# Patient Record
Sex: Female | Born: 1985 | Race: White | Hispanic: Yes | Marital: Single | Smoking: Current some day smoker
Health system: Southern US, Community
[De-identification: ages and names within clinical notes are randomized; demographics above are authoritative.]

---

## 2015-12-07 ENCOUNTER — Ambulatory Visit (HOSPITAL_COMMUNITY)
Admission: EM | Admit: 2015-12-07 | Discharge: 2015-12-07 | Disposition: A | Discharge status: Home / Self Care | Payer: Medicaid Other | Attending: Family Medicine | Admitting: Family Medicine

## 2015-12-07 ENCOUNTER — Encounter (HOSPITAL_COMMUNITY): Payer: Self-pay | Admitting: Emergency Medicine

## 2015-12-07 DIAGNOSIS — N644 Mastodynia: Secondary | ICD-10-CM

## 2015-12-07 DIAGNOSIS — N6452 Nipple discharge: Secondary | ICD-10-CM

## 2015-12-07 DIAGNOSIS — R590 Localized enlarged lymph nodes: Secondary | ICD-10-CM

## 2015-12-07 MED ORDER — CEPHALEXIN 500 MG PO CAPS: 500.0000 mg | ORAL_CAPSULE | Freq: Three times a day (TID) | ORAL | 0 refills | Status: DC

## 2015-12-07 NOTE — ED Provider Notes (Signed)
CSN: [id number]     Arrival date & time 12/07/15  1945 History   First MD Initiated Contact with Patient 12/07/15 2025     Chief Complaint  Patient presents with  . Breast Pain   (Consider location/radiation/quality/duration/timing/severity/associated sxs/prior Treatment) HPI  Paula Heraldmma Ruiz is a 30 y.o. female presenting to UC with c/o Left breast pain for about 1-2 weeks. Pain is aching and sore, 7/10.  She reports having a thick white discharge that looked like pus come from her nipple the other day. Denies redness or warmth of her skin. Denies fever, chills, n/v/d. No change in her menstrual cycles. No family hx of breast cancer. She is new to area, requesting resources for GYN and PCP.   History reviewed. No pertinent past medical history. History reviewed. No pertinent surgical history. No family history on file. Social History  Substance Use Topics  . Smoking status: Never Smoker  . Smokeless tobacco: Never Used  . Alcohol use No   OB History    No data available     Review of Systems  Constitutional: Negative for chills, fever and unexpected weight change.  Respiratory: Negative for cough, chest tightness and shortness of breath.   Cardiovascular: Positive for chest pain (Left breast pain). Negative for palpitations.  Gastrointestinal: Negative for diarrhea, nausea and vomiting.  Skin: Negative for color change and rash.    Allergies  Patient has no allergy information on record.  Home Medications   Prior to Admission medications   Medication Sig Start Date End Date Taking? Authorizing Provider  cephALEXin (KEFLEX) 500 MG capsule Take 1 capsule (500 mg total) by mouth 3 (three) times daily. 12/07/15   Junius FinnerErin O'Malley, PA-C   Meds Ordered and Administered this Visit  Medications - No data to display  BP 111/69 (BP Location: Left Arm)   Pulse 81   Temp 98.2 F (36.8 C) (Oral)   Resp 17   Ht 5\' 6"  (1.676 m)   Wt 165 lb (74.8 kg)   LMP 11/23/2015   SpO2 99%    BMI 26.63 kg/m  No data found.   Physical Exam  Constitutional: She is oriented to person, place, and time. She appears well-developed and well-nourished. No distress.  HENT:  Head: Normocephalic and atraumatic.  Eyes: EOM are normal.  Neck: Normal range of motion.  Cardiovascular: Normal rate and regular rhythm.   Pulmonary/Chest: Effort normal and breath sounds normal. No respiratory distress. She has no wheezes. She has no rales. She exhibits tenderness ( Left breast).  Left breast: tenderness to lateral and inferior aspect of breast. Tender Left axillary lymph notes. No nipple discharge or skin changes noted on exam.   Musculoskeletal: Normal range of motion.  Neurological: She is alert and oriented to person, place, and time.  Skin: Skin is warm and dry. She is not diaphoretic.  Psychiatric: She has a normal mood and affect. Her behavior is normal.  Nursing note and vitals reviewed.   Urgent Care Course   Clinical Course     Procedures (including critical care time)  Labs Review Labs Reviewed - No data to display  Imaging Review No results found.   MDM   1. Breast pain, left   2. Discharge from left nipple   3. Lymphadenopathy, axillary    Pt c/o Left breast pain for about 1 week and reports nipple discharge the other day that looked like pus. Will cover for potential underlying infection, however, strongly recommend f/u with GYN and breast center, especially  if symptoms not improving or new symptoms develop. [employer] resource guide provided.   Junius Finnerrin O'Malley, PA-C 12/07/15 2038

## 2015-12-07 NOTE — Discharge Instructions (Signed)
°Emergency Department Resource Guide °1) Find a Doctor and Pay Out of Pocket °Although you won't have to find out who is covered by your insurance plan, it is a good idea to ask around and get recommendations. You will then need to call the office and see if the doctor you have chosen will accept you as a new patient and what types of options they offer for patients who are self-pay. Some doctors offer discounts or will set up payment plans for their patients who do not have insurance, but you will need to ask so you aren't surprised when you get to your appointment. ° °2) Contact Your Local Health Department °Not all health departments have doctors that can see patients for sick visits, but many do, so it is worth a call to see if yours does. If you don't know where your local health department is, you can check in your phone book. The CDC also has a tool to help you locate your state's health department, and many state websites also have listings of all of their local health departments. ° °3) Find a Walk-in Clinic °If your illness is not likely to be very severe or complicated, you may want to try a walk in clinic. These are popping up all over the country in pharmacies, drugstores, and shopping centers. They're usually staffed by nurse practitioners or physician assistants that have been trained to treat common illnesses and complaints. They're usually fairly quick and inexpensive. However, if you have serious medical issues or chronic medical problems, these are probably not your best option. ° °No Primary Care Doctor: °- Call [facility] at  832-8000 - they can help you locate a primary care doctor that  accepts your insurance, provides certain services, etc. °- Physician Referral Service- 1-800-533-3463 ° °Chronic Pain Problems: °Organization         Address  Phone   Notes  °[facility]  (336) 297-2271 Patients need to be referred by their primary care doctor.  ° °Medication  Assistance: °Organization         Address  Phone   Notes  °[place] [facility] [address] °[place], [place] [address] (336) 641-8030 --Must be a resident of [place] °-- Must have NO insurance coverage whatsoever (no Medicaid/ Medicare, etc.) °-- The pt. MUST have a primary care doctor that directs their care regularly and follows them in the community °  °[facility]  (866) 331-1348   °[employer]  (888) 892-1162   ° °Agencies that provide inexpensive medical care: °Organization         Address  Phone   Notes  °[facility]  (336) 832-8035   °[facility]    (336) 832-7272   °[facility] [address] °[place], [place] [address] (336) 832-4777   °[facility] [address], °[place] (336) 271-4999   °[facility]    (336) 373-0678   °[facility]    (336) 272-1050   °[facility] ° [address], [place] Phone:  (336) 832-4444, Fax:  (336) 832-4440 Hours of Operation:  9 am - 6 pm, M-F.  Also accepts Medicaid/Medicare and self-pay.  °[facility] ° [address], [place] Phone: (336) 832-3150, Fax: (336) 832-3151. Hours of Operation:  8:30 am - 5:30 pm, M-F.  Also accepts Medicaid and self-pay.  °[facility] [place] [address], [place] Phone: (336) 878-6027   °[facility] [address], [place], [place] (336)723-1848, Ext. 123 Mondays & Thursdays: 7-9 AM.  First 15 patients are seen on a first come, first serve basis. °  ° °Medicaid-accepting [place] Providers: ° °Organization         Address  Phone   Notes  °[facility] [address], [place] (336) 641-2100 Also accepts self-pay patients.  °[facility] [address], [place] ° (336) 856-9996   °[facility] [address], [place]  (336) 288-8857   °[facility] [address] [place] [address], [place] (336) 299-7000   °Veita Bland [address], [place]  ° (336) 373-1557 Only accepts [place] Access Medicaid patients after they have their name applied to their card.  ° °Self-Pay (no insurance) in [place]: ° °Organization         Address  Phone   Notes  °Sickle Cell Patients, [facility] [address], [place] (336) 832-1970   °[facility] [address], [place] (336) 832-4400   °[facility] ° [address], [place] (336) 992-4800   °[facility]/Dr. Osei-Bonsu ° [address] [place] [address], [place] or [address], [place] (336) 841-8500 Phone number for both [place] and [place] locations is the same.  °[facility] [address], [place] (336) 299-0000   °[facility] [address] [place] [address], [place] or [address] (336) 852-7530 °(336) 878-2260   °[facility] [address], [place] (336) 350-1642, phone; (336) 294-5005, fax Sees patients 1st and 3rd Saturday of every month.  Must not qualify for public or private insurance (i.e. Medicaid, Medicare, [place] Health Choice, Veterans' Benefits) • Household income should be no more than 200% of the poverty level •The clinic cannot treat you if you are pregnant or think you are pregnant • Sexually transmitted diseases are not treated at the clinic.  ° ° °Dental Care: °Organization         Address  Phone  Notes  °[place] [facility] [address], [place] (336) 641-6152 Accepts children up to age 21 who are enrolled in Medicaid or [place] Health Choice; pregnant women with a Medicaid card; and children who have applied for Medicaid or [place] Health Choice, but were declined, whose parents can pay a reduced fee at time of service.  °[place]  [facility] [place]  [address], [place] (336) 641-7733 Accepts children up to age 21 who are enrolled in Medicaid or [place] Health Choice; pregnant women with a Medicaid card; and children who have applied for Medicaid or [place] Health Choice, but were declined, whose parents can pay a reduced fee at time of service.  °[facility] ° [address],  (336) 641-4533 Patients are seen by appointment only. Walk-ins are not accepted. [facility] will see patients 18 years of age and older. °Monday - Tuesday (8am-5pm) °Most Wednesdays (8:30-5pm) °$30 per visit, cash only  °[facility] ° [address], [place] (336) 641-4533 Patients are seen by appointment only. Walk-ins are not accepted. [facility] will see patients 18 years of age and older. °One   Wednesday Evening (Monthly: Volunteer Based).  $30 per visit, cash only  °[facility]  (919) 537-3737 for adults; Children under age 4, call Graduate Pediatric Dentistry at (919) [phone]. Children aged 4-14, please call (919) [phone] to request a pediatric application. ° Dental services are provided in all areas of dental care including fillings, crowns and bridges, complete and partial dentures, implants, gum treatment, root canals, and extractions. Preventive care is also provided. Treatment is provided to both adults and children. °Patients are selected via a lottery and there is often a waiting list. °  °[facility] [address], °[place] ° (336) 763-8833 [email or web address] °  °[facility] [address], [place], [place] (336)723-1848, Ext. 123 Second and Fourth Thursday of each month, opens at 6:30 AM; Clinic ends at 9 AM.  Patients are seen on a first-come first-served basis, and a limited number are seen during each clinic.  ° °[facility] ° [address], [place], [place] (336) 723-7904    Eligibility Requirements °You must have lived in [place], [place], or [place] counties for at least the last three months. °  You cannot be eligible for state or federal sponsored healthcare insurance, including Veterans Administration, Medicaid, or Medicare. °  You generally cannot be eligible for healthcare insurance through your employer.  °  How to apply: °Eligibility screenings are held every Tuesday and Wednesday afternoon from 1:00 pm until 4:00 pm. You do not need an appointment for the interview!  °[facility] [address], [place], [place] 336-631-2330   °[facility]  336-342-8273   °[facility]  336-703-3100   °[facility]  336-570-6415   ° °Behavioral Health Resources in the Community: °Intensive Outpatient Programs °Organization         Address  Phone  Notes  °[facility] [address], [place], [place] 336-878-6098   °[facility] [address], [place], [place] 336-832-9800   °[facility] [address], [place], [place] ° 336-882-2125   °[facility] [address],  °[place], [place] 1-800-853-5163 or 336-641-4981   °Substance Abuse Resources °Organization         Address  Phone  Notes  °[facility]  336-882-2125   °[facility]  336-784-9470   °[facility]  336-285-9073   °[facility]  336-845-3988   °Residential & Outpatient Substance Abuse Program  1-800-659-3381   °Psychological Services °Organization         Address  Phone  Notes  °[facility]  336- 832-9600   °[facility]  336- 378-7881   °[facility] [address], [place] 1-800-853-5163 or 336-641-4981   ° °Mobile Crisis Teams °Organization         Address  Phone  Notes  °[facility], Mobile Crisis Care Unit  1-877-626-1772   °Assertive °[facility] ° [address]  [place], [place] 336-834-9664   °Sharon DeEsch [address] °[place] 336-554-5454   ° °Self-Help/Support Groups °Organization         Address  Phone             Notes  °[facility] - variety of support groups  336- 373-1402 Call for more information  °Narcotics Anonymous (NA), [facility] [address], °[place]   2 meetings at this location  ° °  Residential Treatment Programs °Organization         Address  Phone  Notes  °[facility] [address],    °[place]  1-866-801-8205   °[facility] ° [address], [place], [place] 704-293-8524   °[facility] [address], [place] 336-845-3988 Admissions: 8am-3pm M-F  °[facility] [address]    °[place], [place] 336-841-1104   °[facility] [address], [place], [place] 336-379-7146   °[facility] [address]  °[place], [place] 336-285-9073   °[facility] Intensive Outpatient [address], [place], [place] 336-852-3033   °[facility] [address]  °[place], [place] 1-877-615-2722 or 336-784-9470   °[facility] [address] [place], [place] 336-227-7417 Accepts Medicaid  °[facility] [address]  °[place] 1-800-659-3381 Substance Abuse/Addiction Treatment  ° °[place] Behavioral Health Resources °Organization         Address  Phone  Notes  °[facility]  (888) 581-9988   °Julie Brannon, PhD [address] [place], [place]   (336) 349-5553 or (336) 951-0000   °[facility]   [address] °[place], [place] (336) 349-4454   °[facility] [address], [place], [place] (336) 342-8316 Insurance/Medicaid/sponsorship through [facility]  °[facility] [address]                                    [place], [place] (336) 342-8316 Therapy/tele-psych/case    °[facility] [address]  ° [place],  (336) 349-2233    °Dr. Arfeen  (336) 349-4544   °[facility] [place]  [employer] [place] [facility] 1) [address], [place] °2) [address], [place] °3)  [address], [place] (336) 349-3220 °(336) 342-7768 ° °(336) 342-8140   °[place] [facility] (336) 342-1394 or (336) 342-3537 (After Hours)    ° ° °

## 2015-12-07 NOTE — ED Triage Notes (Signed)
Pt. Stated, I've been having left breast pain for over a week half

## 2018-01-28 ENCOUNTER — Other Ambulatory Visit: Payer: Self-pay

## 2018-01-28 ENCOUNTER — Encounter (HOSPITAL_COMMUNITY): Payer: Self-pay | Admitting: *Deleted

## 2018-01-28 ENCOUNTER — Ambulatory Visit (HOSPITAL_COMMUNITY)
Admission: EM | Admit: 2018-01-28 | Discharge: 2018-01-28 | Disposition: A | Discharge status: Home / Self Care | Payer: Medicaid Other | Attending: Family Medicine | Admitting: Family Medicine

## 2018-01-28 DIAGNOSIS — F17200 Nicotine dependence, unspecified, uncomplicated: Secondary | ICD-10-CM | POA: Diagnosis not present

## 2018-01-28 DIAGNOSIS — J069 Acute upper respiratory infection, unspecified: Secondary | ICD-10-CM | POA: Insufficient documentation

## 2018-01-28 DIAGNOSIS — J029 Acute pharyngitis, unspecified: Secondary | ICD-10-CM | POA: Diagnosis not present

## 2018-01-28 DIAGNOSIS — B9789 Other viral agents as the cause of diseases classified elsewhere: Secondary | ICD-10-CM

## 2018-01-28 LAB — POCT RAPID STREP A: Streptococcus, Group A Screen (Direct): NEGATIVE

## 2018-01-28 MED ORDER — CETIRIZINE HCL 10 MG PO CAPS: 10.0000 mg | ORAL_CAPSULE | Freq: Every day | ORAL | 0 refills | Status: DC

## 2018-01-28 MED ORDER — AMOXICILLIN-POT CLAVULANATE 875-125 MG PO TABS: 1.0000 | ORAL_TABLET | Freq: Two times a day (BID) | ORAL | 0 refills | Status: AC

## 2018-01-28 MED ORDER — PSEUDOEPH-BROMPHEN-DM 30-2-10 MG/5ML PO SYRP: 5.0000 mL | ORAL_SOLUTION | Freq: Four times a day (QID) | ORAL | 0 refills | Status: DC | PRN

## 2018-01-28 NOTE — Discharge Instructions (Signed)
Sore Throat  Your rapid strep tested Negative today. We will send for a culture and call in about 2 days if results are positive. For now we will treat your sore throat as a virus with symptom management.   Please begin daily cetirizine Cough syrup as needed May fill prescription for Augmentin on Wednesday if you are not having any improvement in your symptoms  Please continue Tylenol or Ibuprofen for fever and pain. May try salt water gargles, cepacol lozenges, throat spray, or OTC cold relief medicine for throat discomfort. If you also have congestion take a daily anti-histamine like Zyrtec, Claritin, and a oral decongestant to help with post nasal drip that may be irritating your throat.   Stay hydrated and drink plenty of fluids to keep your throat coated relieve irritation.

## 2018-01-28 NOTE — ED Triage Notes (Signed)
C/O productive cough and sore throat x 1 wk; states had tactile fever few days ago.

## 2018-01-29 NOTE — ED Provider Notes (Signed)
[facility]    CSN: [id number] Arrival date & time: 01/28/18  1358     History   Chief Complaint Chief Complaint  Patient presents with  . Cough  . Sore Throat    HPI Paula Ruiz is a 32 y.o. female no significant past medical history presenting today for evaluation of cough and sore throat.  Patient states that she has had symptoms for the past 5 to 6 days.  She has had subjective fevers.  Noted she is coughing up a lot of mucus.  She has tried DayQuil, Vicks and Mucinex without relief.  Main complaint is her sore throat.  HPI  History reviewed. No pertinent past medical history.  There are no active problems to display for this patient.   History reviewed. No pertinent surgical history.  OB History   No obstetric history on file.      Home Medications    Prior to Admission medications   Medication Sig Start Date End Date Taking? Authorizing Provider  amoxicillin-clavulanate (AUGMENTIN) 875-125 MG tablet Take 1 tablet by mouth every 12 (twelve) hours for 10 days. 01/31/18 02/10/18  Wieters, Hallie C, PA-C  brompheniramine-pseudoephedrine-DM 30-2-10 MG/5ML syrup Take 5 mLs by mouth 4 (four) times daily as needed. 01/28/18   Wieters, Hallie C, PA-C  Cetirizine HCl 10 MG CAPS Take 1 capsule (10 mg total) by mouth daily for 10 days. 01/28/18 02/07/18  Wieters, Junius CreamerHallie C, PA-C    Family History History reviewed. No pertinent family history.  Social History Social History   Tobacco Use  . Smoking status: Current Some Day Smoker  . Smokeless tobacco: Never Used  Substance Use Topics  . Alcohol use: Yes    Comment: occasionally  . Drug use: Never     Allergies   Patient has no known allergies.   Review of Systems Review of Systems  Constitutional: Positive for chills. Negative for activity change, appetite change, fatigue and fever.  HENT: Positive for congestion, rhinorrhea and sore throat. Negative for ear pain, sinus pressure and trouble  swallowing.   Eyes: Negative for discharge and redness.  Respiratory: Positive for cough. Negative for chest tightness and shortness of breath.   Cardiovascular: Negative for chest pain.  Gastrointestinal: Negative for abdominal pain, diarrhea, nausea and vomiting.  Musculoskeletal: Negative for myalgias.  Skin: Negative for rash.  Neurological: Negative for dizziness, light-headedness and headaches.     Physical Exam Triage Vital Signs ED Triage Vitals  Enc Vitals Group     BP 01/28/18 1518 122/81     Pulse Rate 01/28/18 1518 70     Resp 01/28/18 1518 16     Temp 01/28/18 1518 98.2 F (36.8 C)     Temp Source 01/28/18 1518 Oral     SpO2 01/28/18 1518 97 %     Weight --      Height --      Head Circumference --      Peak Flow --      Pain Score 01/28/18 1519 8     Pain Loc --      Pain Edu? --      Excl. in GC? --    No data found.  Updated Vital Signs BP 122/81   Pulse 70   Temp 98.2 F (36.8 C) (Oral)   Resp 16   LMP 01/16/2018 (Approximate)   SpO2 97%   Visual Acuity Right Eye Distance:   Left Eye Distance:   Bilateral Distance:    Right Eye  Near:   Left Eye Near:    Bilateral Near:     Physical Exam Vitals signs and nursing note reviewed.  Constitutional:      General: She is not in acute distress.    Appearance: She is well-developed.  HENT:     Head: Normocephalic and atraumatic.     Ears:     Comments: Bilateral ears without tenderness to palpation of external auricle, tragus and mastoid, EAC's without erythema or swelling, TM's with good bony landmarks and cone of light. Non erythematous.    Nose:     Comments: Nasal mucosa slightly erythematous    Mouth/Throat:     Comments: Oral mucosa pink and moist, no tonsillar enlargement or exudate. Posterior pharynx patent and erythematous, no uvula deviation or swelling. Normal phonation. Eyes:     Conjunctiva/sclera: Conjunctivae normal.  Neck:     Musculoskeletal: Neck supple.  Cardiovascular:      Rate and Rhythm: Normal rate and regular rhythm.     Heart sounds: No murmur.  Pulmonary:     Effort: Pulmonary effort is normal. No respiratory distress.     Breath sounds: Normal breath sounds.     Comments: Breathing comfortably at rest, CTABL, no wheezing, rales or other adventitious sounds auscultated Abdominal:     Palpations: Abdomen is soft.     Tenderness: There is no abdominal tenderness.  Skin:    General: Skin is warm and dry.  Neurological:     Mental Status: She is alert.      UC Treatments / Results  Labs (all labs ordered are listed, but only abnormal results are displayed) Labs Reviewed  CULTURE, GROUP A STREP [facility]  POCT RAPID STREP A    EKG None  Radiology No results found.  Procedures Procedures (including critical care time)  Medications Ordered in UC Medications - No data to display  Initial Impression / Assessment and Plan / UC Course  I have reviewed the triage vital signs and the nursing notes.  Pertinent labs & imaging results that were available during my care of the patient were reviewed by me and considered in my medical decision making (see chart for details).     Strep test negative.  Sore throat likely secondary to postnasal drainage.  No other URI symptoms for approximately 1 week.  Will recommend continued symptomatic management.  Recommended starting daily allergy pill to treat postnasal drainage.  Did provide prescription for Augmentin to fill on Wednesday in 3 to 4 days if still not having any improvement with continued symptomatic management.  Continue to monitor symptoms, temperature,Discussed strict return precautions. Patient verbalized understanding and is agreeable with plan.  Final Clinical Impressions(s) / UC Diagnoses   Final diagnoses:  Viral URI with cough  Sore throat     Discharge Instructions     Sore Throat  Your rapid strep tested Negative today. We will send for a culture and call in about 2 days if  results are positive. For now we will treat your sore throat as a virus with symptom management.   Please begin daily cetirizine Cough syrup as needed May fill prescription for Augmentin on Wednesday if you are not having any improvement in your symptoms  Please continue Tylenol or Ibuprofen for fever and pain. May try salt water gargles, cepacol lozenges, throat spray, or OTC cold relief medicine for throat discomfort. If you also have congestion take a daily anti-histamine like Zyrtec, Claritin, and a oral decongestant to help with post nasal drip  that may be irritating your throat.   Stay hydrated and drink plenty of fluids to keep your throat coated relieve irritation.      ED Prescriptions    Medication Sig Dispense Auth. Provider   brompheniramine-pseudoephedrine-DM 30-2-10 MG/5ML syrup Take 5 mLs by mouth 4 (four) times daily as needed. 120 mL Wieters, Hallie C, PA-C   Cetirizine HCl 10 MG CAPS Take 1 capsule (10 mg total) by mouth daily for 10 days. 10 capsule Wieters, Hallie C, PA-C   amoxicillin-clavulanate (AUGMENTIN) 875-125 MG tablet Take 1 tablet by mouth every 12 (twelve) hours for 10 days. 20 tablet Wieters, Redby C, PA-C     Controlled Substance Prescriptions [place] Controlled Substance Registry consulted? Not Applicable   Lew Dawes, [place] 01/29/18 2031

## 2018-01-31 LAB — CULTURE, GROUP A STREP (THRC)

## 2019-08-22 ENCOUNTER — Other Ambulatory Visit: Payer: Self-pay

## 2019-08-22 ENCOUNTER — Encounter (HOSPITAL_COMMUNITY): Payer: Self-pay

## 2019-08-22 ENCOUNTER — Ambulatory Visit (INDEPENDENT_AMBULATORY_CARE_PROVIDER_SITE_OTHER): Payer: Medicaid Other

## 2019-08-22 ENCOUNTER — Ambulatory Visit (HOSPITAL_COMMUNITY)
Admission: EM | Admit: 2019-08-22 | Discharge: 2019-08-22 | Disposition: A | Discharge status: Home / Self Care | Payer: Medicaid Other | Attending: Physician Assistant | Admitting: Physician Assistant

## 2019-08-22 DIAGNOSIS — M25562 Pain in left knee: Secondary | ICD-10-CM | POA: Diagnosis not present

## 2019-08-22 DIAGNOSIS — S8002XA Contusion of left knee, initial encounter: Secondary | ICD-10-CM

## 2019-08-22 MED ORDER — DICLOFENAC SODIUM 1 % EX GEL: 4.0000 g | Freq: Four times a day (QID) | CUTANEOUS | 0 refills | Status: DC

## 2019-08-22 MED ORDER — ACETAMINOPHEN 325 MG PO TABS: 650.0000 mg | ORAL_TABLET | Freq: Four times a day (QID) | ORAL | 0 refills | Status: DC | PRN

## 2019-08-22 NOTE — ED Triage Notes (Addendum)
L knee pain after GLF on Sunday, pt ambulatory

## 2019-08-22 NOTE — Discharge Instructions (Signed)
There was no breaks or fractures on your xray  Apply the gel/cream 4 times a day Take 2 tylenol every 6 hours Alternate ice and heat Use ace wrap as needed  Follow up with PCP or sports med group as needed if not improving

## 2019-08-22 NOTE — ED Provider Notes (Signed)
[facility]    CSN: [id number] Arrival date & time: 08/22/19  1557      History   Chief Complaint Chief Complaint  Patient presents with  . Knee Pain    HPI Paula Ruiz is a 34 y.o. female.   Patient reports her left knee pain.  She reports she fell 5 days ago directly onto the left knee.  She reports she did not twist the knee.  She reports there was some swelling and quite a bit of pain and took it easy for a few days.  She has since been trying to limp but is had progressive worsening pain in the front of her knee.  Swelling is resolved mostly.  She reports she had a surgery on this knee previously when she had broken it.  She does not remember exactly what they did.  She denies any numbness or tingling in the leg.     History reviewed. No pertinent past medical history.  There are no problems to display for this patient.   History reviewed. No pertinent surgical history.  OB History   No obstetric history on file.      Home Medications    Prior to Admission medications   Medication Sig Start Date End Date Taking? Authorizing Provider  acetaminophen (TYLENOL) 325 MG tablet Take 2 tablets (650 mg total) by mouth every 6 (six) hours as needed. 08/22/19   Sarkis Rhines, Veryl Speak, PA-C  diclofenac Sodium (VOLTAREN) 1 % GEL Apply 4 g topically 4 (four) times daily. 08/22/19   Dois Juarbe, Veryl Speak, PA-C    Family History History reviewed. No pertinent family history.  Social History Social History   Tobacco Use  . Smoking status: Current Some Day Smoker  . Smokeless tobacco: Never Used  Vaping Use  . Vaping Use: Every day  Substance Use Topics  . Alcohol use: Yes    Comment: occasionally  . Drug use: Never     Allergies   Patient has no known allergies.   Review of Systems Review of Systems   Physical Exam Triage Vital Signs ED Triage Vitals  Enc Vitals Group     BP 08/22/19 1647 125/75     Pulse Rate 08/22/19 1647 62     Resp 08/22/19 1647 16      Temp 08/22/19 1647 (!) 97 F (36.1 C)     Temp src --      SpO2 08/22/19 1647 100 %     Weight --      Height --      Head Circumference --      Peak Flow --      Pain Score 08/22/19 1648 5     Pain Loc --      Pain Edu? --      Excl. in GC? --    No data found.  Updated Vital Signs BP 125/75   Pulse 62   Temp (!) 97 F (36.1 C)   Resp 16   LMP 06/22/2019 Comment: patient's BC keeps here from not having a mentral cycle  SpO2 100%   Visual Acuity Right Eye Distance:   Left Eye Distance:   Bilateral Distance:    Right Eye Near:   Left Eye Near:    Bilateral Near:     Physical Exam Vitals and nursing note reviewed.  Constitutional:      Appearance: Normal appearance.  Musculoskeletal:     Comments: Left knee without significant swelling or effusion.  No deformity.  No ecchymosis.  Small abrasion just inferior to the patella.  Joint is not warm.  Tenderness over the patella and infrapatellar.  Pain with patellar compression.  Some pain at the medial and lateral joint lines as well.  Anterior drawer stable.  Posterior drawer stable.  Range of motion intact however pain elicited with terminal extension.  Patient is able to bear weight however significant limp.  Distal pulse 2+.  Sensation intact.  Skin:    Capillary Refill: Capillary refill takes less than 2 seconds.  Neurological:     Mental Status: She is alert.      UC Treatments / Results  Labs (all labs ordered are listed, but only abnormal results are displayed) Labs Reviewed - No data to display  EKG   Radiology DG Knee Complete 4 Views Left  Result Date: 08/22/2019 CLINICAL DATA:  Fall with worsening knee pain EXAM: LEFT KNEE - COMPLETE 4+ VIEW COMPARISON:  None. FINDINGS: No fracture or malalignment. Joint spaces are maintained. Probable small knee effusion. IMPRESSION: No acute osseous abnormality. Electronically Signed   By: Jasmine Pang M.D.   On: 08/22/2019 18:47    Procedures Procedures  (including critical care time)  Medications Ordered in UC Medications - No data to display  Initial Impression / Assessment and Plan / UC Course  I have reviewed the triage vital signs and the nursing notes.  Pertinent labs & imaging results that were available during my care of the patient were reviewed by me and considered in my medical decision making (see chart for details).    #Knee contusion Patient is a 34 year old presenting with knee contusion.  Fracture negative for bony pathology.  Will supply Ace wrap.  Recommend topical Voltaren and Tylenol with ice and heat therapy.  Discussed option for follow-up with the sports medicine group if not improving her primary care as needed.  Patient verbalized understanding plan of care.  Final Clinical Impressions(s) / UC Diagnoses   Final diagnoses:  Contusion of left knee, initial encounter     Discharge Instructions     There was no breaks or fractures on your xray  Apply the gel/cream 4 times a day Take 2 tylenol every 6 hours Alternate ice and heat Use ace wrap as needed  Follow up with PCP or sports med group as needed if not improving      ED Prescriptions    Medication Sig Dispense Auth. Provider   diclofenac Sodium (VOLTAREN) 1 % GEL Apply 4 g topically 4 (four) times daily. 100 g Yesennia Hirota, Veryl Speak, PA-C   acetaminophen (TYLENOL) 325 MG tablet Take 2 tablets (650 mg total) by mouth every 6 (six) hours as needed. 30 tablet Obe Ahlers, Veryl Speak, PA-C     PDMP not reviewed this encounter.   Hermelinda Medicus, PA-C 08/22/19 2345

## 2020-06-08 ENCOUNTER — Emergency Department (HOSPITAL_COMMUNITY)
Admission: EM | Admit: 2020-06-08 | Discharge: 2020-06-08 | Disposition: A | Discharge status: Home / Self Care | Payer: Medicaid Other | Attending: Emergency Medicine | Admitting: Emergency Medicine

## 2020-06-08 ENCOUNTER — Encounter (HOSPITAL_COMMUNITY): Payer: Self-pay | Admitting: Pharmacy Technician

## 2020-06-08 ENCOUNTER — Emergency Department (HOSPITAL_COMMUNITY): Payer: Medicaid Other

## 2020-06-08 ENCOUNTER — Other Ambulatory Visit: Payer: Self-pay

## 2020-06-08 DIAGNOSIS — F17200 Nicotine dependence, unspecified, uncomplicated: Secondary | ICD-10-CM | POA: Diagnosis not present

## 2020-06-08 DIAGNOSIS — N939 Abnormal uterine and vaginal bleeding, unspecified: Secondary | ICD-10-CM | POA: Diagnosis not present

## 2020-06-08 LAB — CBC WITH DIFFERENTIAL/PLATELET
Abs Immature Granulocytes: 0.03 10*3/uL (ref 0.00–0.07)
Basophils Absolute: 0.1 10*3/uL (ref 0.0–0.1)
Basophils Relative: 1 %
Eosinophils Absolute: 0.5 10*3/uL (ref 0.0–0.5)
Eosinophils Relative: 5 %
HCT: 38.7 % (ref 36.0–46.0)
Hemoglobin: 12.8 g/dL (ref 12.0–15.0)
Immature Granulocytes: 0 %
Lymphocytes Relative: 38 %
Lymphs Abs: 3.5 10*3/uL (ref 0.7–4.0)
MCH: 30.3 pg (ref 26.0–34.0)
MCHC: 33.1 g/dL (ref 30.0–36.0)
MCV: 91.7 fL (ref 80.0–100.0)
Monocytes Absolute: 0.6 10*3/uL (ref 0.1–1.0)
Monocytes Relative: 7 %
Neutro Abs: 4.5 10*3/uL (ref 1.7–7.7)
Neutrophils Relative %: 49 %
Platelets: 257 10*3/uL (ref 150–400)
RBC: 4.22 MIL/uL (ref 3.87–5.11)
RDW: 13.6 % (ref 11.5–15.5)
WBC: 9.1 10*3/uL (ref 4.0–10.5)
nRBC: 0 % (ref 0.0–0.2)

## 2020-06-08 LAB — I-STAT BETA HCG BLOOD, ED (MC, WL, AP ONLY): I-stat hCG, quantitative: <5 m[IU]/mL (ref <5)

## 2020-06-08 MED ORDER — IBUPROFEN 600 MG PO TABS: 600.0000 mg | ORAL_TABLET | Freq: Three times a day (TID) | ORAL | 0 refills | Status: DC

## 2020-06-08 NOTE — ED Notes (Signed)
CBC redrawn per RN request. Lab did not have purple top.

## 2020-06-08 NOTE — ED Triage Notes (Signed)
Pt here POV with reports of heavy menses with blood clots. Pt states she stopped the depo shot in December and that this is here first menstrual cycle since then.

## 2020-06-08 NOTE — ED Provider Notes (Signed)
Emergency Medicine Provider Triage Evaluation Note  Paula Ruiz , a 35 y.o. female  was evaluated in triage.  Pt complains of heavy vaginal bleeding onset Wednesday last week with clots, heavier than usual menstrual cycles.  Denies abdominal pain or any other complaints or concerns.  Review of Systems  Positive: Heavy vaginal bleeding Negative: Abdominal pain  Physical Exam  BP 131/84 (BP Location: Right Arm)   Pulse 75   Temp 98.7 F (37.1 C) (Oral)   Resp 15   SpO2 100%  Gen:   Awake, no distress   Resp:  Normal effort  MSK:   Moves extremities without difficulty  Other:    Medical Decision Making  Medically screening exam initiated at 3:53 PM.  Appropriate orders placed.  Kortney Potvin was informed that the remainder of the evaluation will be completed by another provider, this initial triage assessment does not replace that evaluation, and the importance of remaining in the ED until their evaluation is complete.     Jeannie Fend, PA-C 06/08/20 1555    Bethann Berkshire, MD 06/09/20 [phone]

## 2020-06-08 NOTE — ED Notes (Signed)
All appropriate discharge materials reviewed at length with patient. Time for questions provided. Pt has no other questions at this time and verbalizes understanding of all provided materials.  

## 2020-06-08 NOTE — Discharge Instructions (Signed)
Please follow-up with your gynecologist.  Recommend taking scheduled Motrin for the next week.  If you develop worsening bleeding, pain, any episodes of passing out or vomiting, come back to ER for reassessment.

## 2020-06-08 NOTE — ED Provider Notes (Signed)
[facility] EMERGENCY DEPARTMENT Provider Note   CSN: [id number] Arrival date & time: 06/08/20  1527     History Chief Complaint  Patient presents with  . Vaginal Bleeding    Paula Ruiz is a 35 y.o. female.  Presented to the emergency room with concern for vaginal bleeding.  Patient reports that shortly over the past few days she has been having heavy bleeding.  Has used approximately 5 pads today with some clots.  She denies any other associated symptoms.  No abdominal pain, no pelvic pain.  No discharge.  States that she used to use the Depo shot but stopped that a few months ago.  Not currently on any medication.  HPI     History reviewed. No pertinent past medical history.  There are no problems to display for this patient.   History reviewed. No pertinent surgical history.   OB History   No obstetric history on file.     No family history on file.  Social History   Tobacco Use  . Smoking status: Current Some Day Smoker  . Smokeless tobacco: Never Used  Vaping Use  . Vaping Use: Every day  Substance Use Topics  . Alcohol use: Yes    Comment: occasionally  . Drug use: Never    Home Medications Prior to Admission medications   Medication Sig Start Date End Date Taking? Authorizing Provider  ibuprofen (ADVIL) 600 MG tablet Take 1 tablet (600 mg total) by mouth 3 (three) times daily. 06/08/20  Yes Milagros Loll, MD  acetaminophen (TYLENOL) 325 MG tablet Take 2 tablets (650 mg total) by mouth every 6 (six) hours as needed. 08/22/19   Darr, Gerilyn Pilgrim, PA-C  diclofenac Sodium (VOLTAREN) 1 % GEL Apply 4 g topically 4 (four) times daily. 08/22/19   Darr, Gerilyn Pilgrim, PA-C    Allergies    Patient has no known allergies.  Review of Systems   Review of Systems  Constitutional: Negative for chills and fever.  HENT: Negative for ear pain and sore throat.   Eyes: Negative for pain and visual disturbance.  Respiratory: Negative for cough and shortness of  breath.   Cardiovascular: Negative for chest pain and palpitations.  Gastrointestinal: Negative for abdominal pain and vomiting.  Genitourinary: Positive for vaginal bleeding. Negative for dysuria and hematuria.  Musculoskeletal: Negative for arthralgias and back pain.  Skin: Negative for color change and rash.  Neurological: Negative for seizures and syncope.  All other systems reviewed and are negative.   Physical Exam Updated Vital Signs BP (!) 123/93 (BP Location: Right Arm)   Pulse 72   Temp 98.5 F (36.9 C) (Oral)   Resp 18   SpO2 99%   Physical Exam Vitals and nursing note reviewed. Exam conducted with a chaperone present.  Constitutional:      General: She is not in acute distress.    Appearance: She is well-developed.  HENT:     Head: Normocephalic and atraumatic.  Eyes:     Conjunctiva/sclera: Conjunctivae normal.  Cardiovascular:     Rate and Rhythm: Normal rate and regular rhythm.     Heart sounds: No murmur heard.   Pulmonary:     Effort: Pulmonary effort is normal. No respiratory distress.     Breath sounds: Normal breath sounds.  Abdominal:     Palpations: Abdomen is soft.     Tenderness: There is no abdominal tenderness.  Genitourinary:    Comments: Chaperone present Small blood in vagina, no active bleeding noted,  normal-appearing cervix Musculoskeletal:        General: No deformity or signs of injury.     Cervical back: Neck supple.  Skin:    General: Skin is warm and dry.  Neurological:     General: No focal deficit present.     Mental Status: She is alert.  Psychiatric:        Mood and Affect: Mood normal.     ED Results / Procedures / Treatments   Labs (all labs ordered are listed, but only abnormal results are displayed) Labs Reviewed  CBC WITH DIFFERENTIAL/PLATELET  I-STAT BETA HCG BLOOD, ED ([facility], [facility], [facility] ONLY)    EKG None  Radiology US Transvaginal Non-OB  Result Date: 06/08/2020 CLINICAL DATA:  Initial evaluation for acute  pelvic pain and bleeding. EXAM: TRANSABDOMINAL AND TRANSVAGINAL ULTRASOUND OF PELVIS DOPPLER ULTRASOUND OF OVARIES TECHNIQUE: Both transabdominal and transvaginal ultrasound examinations of the pelvis were performed. Transabdominal technique was performed for global imaging of the pelvis including uterus, ovaries, adnexal regions, and pelvic cul-de-sac. It was necessary to proceed with endovaginal exam following the transabdominal exam to visualize the uterus, endometrium, and ovaries. Color and duplex Doppler ultrasound was utilized to evaluate blood flow to the ovaries. COMPARISON:  None. FINDINGS: Uterus Measurements: 7.9 x 3.8 x 4.1 cm = volume: 63.3 mL. Uterus is anteverted. No discrete fibroid or other mass. Endometrium Thickness: 6.6 mm.  No focal abnormality visualized. Right ovary Measurements: 3.9 x 2.3 x 2.2 cm = volume: 10.4 mL. Normal appearance/no adnexal mass. Left ovary Measurements: 3.8 x 2.1 x 2.2 cm = volume: 9.3 mL. Normal appearance/no adnexal mass. Pulsed Doppler evaluation of both ovaries demonstrates normal low-resistance arterial and venous waveforms. Other findings No abnormal free fluid. IMPRESSION: 1. Normal pelvic ultrasound. No evidence for torsion or other acute abnormality. 2. Endometrial stripe measures 6.6 mm in thickness. If bleeding remains unresponsive to hormonal or medical therapy, sonohysterogram should be considered for focal lesion work-up. (Ref: Radiological Reasoning: Algorithmic Workup of Abnormal Vaginal Bleeding with Endovaginal Sonography and Sonohysterography. AJR 2008; 505:L97-67). Electronically Signed   By: Rise Mu M.D.   On: 06/08/2020 21:44   US Pelvis Complete  Result Date: 06/08/2020 CLINICAL DATA:  Initial evaluation for acute pelvic pain and bleeding. EXAM: TRANSABDOMINAL AND TRANSVAGINAL ULTRASOUND OF PELVIS DOPPLER ULTRASOUND OF OVARIES TECHNIQUE: Both transabdominal and transvaginal ultrasound examinations of the pelvis were performed.  Transabdominal technique was performed for global imaging of the pelvis including uterus, ovaries, adnexal regions, and pelvic cul-de-sac. It was necessary to proceed with endovaginal exam following the transabdominal exam to visualize the uterus, endometrium, and ovaries. Color and duplex Doppler ultrasound was utilized to evaluate blood flow to the ovaries. COMPARISON:  None. FINDINGS: Uterus Measurements: 7.9 x 3.8 x 4.1 cm = volume: 63.3 mL. Uterus is anteverted. No discrete fibroid or other mass. Endometrium Thickness: 6.6 mm.  No focal abnormality visualized. Right ovary Measurements: 3.9 x 2.3 x 2.2 cm = volume: 10.4 mL. Normal appearance/no adnexal mass. Left ovary Measurements: 3.8 x 2.1 x 2.2 cm = volume: 9.3 mL. Normal appearance/no adnexal mass. Pulsed Doppler evaluation of both ovaries demonstrates normal low-resistance arterial and venous waveforms. Other findings No abnormal free fluid. IMPRESSION: 1. Normal pelvic ultrasound. No evidence for torsion or other acute abnormality. 2. Endometrial stripe measures 6.6 mm in thickness. If bleeding remains unresponsive to hormonal or medical therapy, sonohysterogram should be considered for focal lesion work-up. (Ref: Radiological Reasoning: Algorithmic Workup of Abnormal Vaginal Bleeding with Endovaginal Sonography and Sonohysterography.  AJR 2008; 326:Z12-45). Electronically Signed   By: Rise Mu M.D.   On: 06/08/2020 21:44   [place] Art/Ven Flow Abd Pelv Doppler  Result Date: 06/08/2020 CLINICAL DATA:  Initial evaluation for acute pelvic pain and bleeding. EXAM: TRANSABDOMINAL AND TRANSVAGINAL ULTRASOUND OF PELVIS DOPPLER ULTRASOUND OF OVARIES TECHNIQUE: Both transabdominal and transvaginal ultrasound examinations of the pelvis were performed. Transabdominal technique was performed for global imaging of the pelvis including uterus, ovaries, adnexal regions, and pelvic cul-de-sac. It was necessary to proceed with endovaginal exam following the  transabdominal exam to visualize the uterus, endometrium, and ovaries. Color and duplex Doppler ultrasound was utilized to evaluate blood flow to the ovaries. COMPARISON:  None. FINDINGS: Uterus Measurements: 7.9 x 3.8 x 4.1 cm = volume: 63.3 mL. Uterus is anteverted. No discrete fibroid or other mass. Endometrium Thickness: 6.6 mm.  No focal abnormality visualized. Right ovary Measurements: 3.9 x 2.3 x 2.2 cm = volume: 10.4 mL. Normal appearance/no adnexal mass. Left ovary Measurements: 3.8 x 2.1 x 2.2 cm = volume: 9.3 mL. Normal appearance/no adnexal mass. Pulsed Doppler evaluation of both ovaries demonstrates normal low-resistance arterial and venous waveforms. Other findings No abnormal free fluid. IMPRESSION: 1. Normal pelvic ultrasound. No evidence for torsion or other acute abnormality. 2. Endometrial stripe measures 6.6 mm in thickness. If bleeding remains unresponsive to hormonal or medical therapy, sonohysterogram should be considered for focal lesion work-up. (Ref: Radiological Reasoning: Algorithmic Workup of Abnormal Vaginal Bleeding with Endovaginal Sonography and Sonohysterography. AJR 2008; 809:X83-38). Electronically Signed   By: Rise Mu M.D.   On: 06/08/2020 21:44    Procedures Procedures   Medications Ordered in ED Medications - No data to display  ED Course  I have reviewed the triage vital signs and the nursing notes.  Pertinent labs & imaging results that were available during my care of the patient were reviewed by me and considered in my medical decision making (see chart for details).    MDM Rules/Calculators/A&P                         35 year old lady presenting to ER with concern for vaginal bleeding.  On exam patient is well-appearing in no distress.  Hemoglobin stable.  Pregnancy negative.  Normal pelvic ultrasound.  Recommend trial of NSAIDs, follow-up with gynecology for further management.  After the discussed management above, the patient was  determined to be safe for discharge.  The patient was in agreement with this plan and all questions regarding their care were answered.  ED return precautions were discussed and the patient will return to the ED with any significant worsening of condition.    Final Clinical Impression(s) / ED Diagnoses Final diagnoses:  Vaginal bleeding    Rx / DC Orders ED Discharge Orders         Ordered    ibuprofen (ADVIL) 600 MG tablet  3 times daily        06/08/20 2223           Milagros Loll, MD 06/09/20 [phone]

## 2020-06-25 ENCOUNTER — Encounter (HOSPITAL_COMMUNITY): Payer: Self-pay | Admitting: Emergency Medicine

## 2020-06-25 ENCOUNTER — Ambulatory Visit (HOSPITAL_COMMUNITY)
Admission: EM | Admit: 2020-06-25 | Discharge: 2020-06-25 | Disposition: A | Discharge status: Home / Self Care | Payer: Medicaid Other | Attending: Physician Assistant | Admitting: Physician Assistant

## 2020-06-25 DIAGNOSIS — J329 Chronic sinusitis, unspecified: Secondary | ICD-10-CM

## 2020-06-25 DIAGNOSIS — J40 Bronchitis, not specified as acute or chronic: Secondary | ICD-10-CM

## 2020-06-25 DIAGNOSIS — R058 Other specified cough: Secondary | ICD-10-CM

## 2020-06-25 MED ORDER — CEFDINIR 300 MG PO CAPS: 300.0000 mg | ORAL_CAPSULE | Freq: Two times a day (BID) | ORAL | 0 refills | Status: DC

## 2020-06-25 MED ORDER — PREDNISONE 20 MG PO TABS: 40.0000 mg | ORAL_TABLET | Freq: Every day | ORAL | 0 refills | Status: AC

## 2020-06-25 MED ORDER — PROMETHAZINE-DM 6.25-15 MG/5ML PO SYRP: 5.0000 mL | ORAL_SOLUTION | Freq: Two times a day (BID) | ORAL | 0 refills | Status: DC | PRN

## 2020-06-25 NOTE — Discharge Instructions (Addendum)
Take Omnicef twice daily to cover for sinus infection.  Take prednisone (2 tablets) in the morning for 4 days.  You should not take NSAIDs (aspirin, ibuprofen/Advil, naproxen/Aleve) with this medication as it can cause stomach bleeding.  You can use Promethazine DM for cough but this will make you sleepy do not drive or drink alcohol with it.  Please use Mucinex and Flonase for symptom relief.  Make sure you are drinking plenty of fluid.  If symptoms do not improve with this treatment or if anything worsens please return for reevaluation.

## 2020-06-25 NOTE — ED Provider Notes (Addendum)
[facility]    CSN: [id number] Arrival date & time: 06/25/20  1758      History   Chief Complaint Chief Complaint  Patient presents with  . URI    HPI Paula Ruiz is a 35 y.o. female.   Patient presents today with a several week history of productive cough.  She describes sputum as thick and colored.  Reports associated sinus pressure, postnasal drainage, sore throat, headache.  Denies any chest pain, shortness of breath, fever, nausea, vomiting.  She has tried multiple over-the-counter medications including DayQuil, NyQuil, TheraFlu, Mucinex without improvement of symptoms.  Reports household sick contacts with similar symptoms when symptoms began but states they have recovered and she continues to have symptoms.  She denies history of allergies, asthma, smoking.  Denies any recent antibiotic use.  She has not had influenza or COVID-19 vaccination.  She is having difficulty with daily duties as a result of symptoms.  She has no concern for pregnancy.     History reviewed. No pertinent past medical history.  There are no problems to display for this patient.   History reviewed. No pertinent surgical history.  OB History   No obstetric history on file.      Home Medications    Prior to Admission medications   Medication Sig Start Date End Date Taking? Authorizing Provider  acetaminophen (TYLENOL) 325 MG tablet Take 2 tablets (650 mg total) by mouth every 6 (six) hours as needed. 08/22/19  Yes Darr, Gerilyn Pilgrim, PA-C  cefdinir (OMNICEF) 300 MG capsule Take 1 capsule (300 mg total) by mouth 2 (two) times daily. 06/25/20  Yes Shenia Alan K, PA-C  diclofenac Sodium (VOLTAREN) 1 % GEL Apply 4 g topically 4 (four) times daily. 08/22/19  Yes Darr, Gerilyn Pilgrim, PA-C  ibuprofen (ADVIL) 600 MG tablet Take 1 tablet (600 mg total) by mouth 3 (three) times daily. 06/08/20  Yes Milagros Loll, MD  predniSONE (DELTASONE) 20 MG tablet Take 2 tablets (40 mg total) by mouth daily with  breakfast for 4 days. 06/25/20 06/29/20 Yes Augusto Deckman K, PA-C  promethazine-dextromethorphan (PROMETHAZINE-DM) 6.25-15 MG/5ML syrup Take 5 mLs by mouth 2 (two) times daily as needed for cough. 06/25/20  Yes Briant Angelillo, Noberto Retort, PA-C    Family History History reviewed. No pertinent family history.  Social History Social History   Tobacco Use  . Smoking status: Current Some Day Smoker  . Smokeless tobacco: Never Used  Vaping Use  . Vaping Use: Every day  Substance Use Topics  . Alcohol use: Yes    Comment: occasionally  . Drug use: Never     Allergies   Patient has no known allergies.   Review of Systems Review of Systems  Constitutional: Positive for activity change. Negative for appetite change, fatigue and fever.  HENT: Positive for congestion, sinus pressure and sore throat. Negative for sneezing.   Respiratory: Positive for cough. Negative for shortness of breath.   Cardiovascular: Negative for chest pain.  Gastrointestinal: Negative for abdominal pain, diarrhea, nausea and vomiting.  Musculoskeletal: Positive for arthralgias and myalgias.  Neurological: Positive for headaches. Negative for dizziness and light-headedness.     Physical Exam Triage Vital Signs ED Triage Vitals  Enc Vitals Group     BP 06/25/20 1900 121/76     Pulse Rate 06/25/20 1900 67     Resp --      Temp 06/25/20 1900 99.1 F (37.3 C)     Temp Source 06/25/20 1900 Oral  SpO2 06/25/20 1900 99 %     Weight --      Height --      Head Circumference --      Peak Flow --      Pain Score 06/25/20 1903 8     Pain Loc --      Pain Edu? --      Excl. in GC? --    No data found.  Updated Vital Signs BP 121/76 (BP Location: Left Arm)   Pulse 67   Temp 99.1 F (37.3 C) (Oral)   SpO2 99%   Visual Acuity Right Eye Distance:   Left Eye Distance:   Bilateral Distance:    Right Eye Near:   Left Eye Near:    Bilateral Near:     Physical Exam Vitals reviewed.  Constitutional:       General: She is awake. She is not in acute distress.    Appearance: Normal appearance. She is not ill-appearing.     Comments: Very pleasant female appears stated age in no acute distress  HENT:     Head: Normocephalic and atraumatic.     Right Ear: Tympanic membrane, ear canal and external ear normal. Tympanic membrane is not erythematous or bulging.     Left Ear: Tympanic membrane, ear canal and external ear normal. Tympanic membrane is not erythematous or bulging.     Nose: Rhinorrhea present. Rhinorrhea is clear.     Right Sinus: Maxillary sinus tenderness and frontal sinus tenderness present.     Left Sinus: Maxillary sinus tenderness and frontal sinus tenderness present.     Mouth/Throat:     Pharynx: Uvula midline. Posterior oropharyngeal erythema present. No oropharyngeal exudate.     Comments: Moderate erythema and drainage in posterior oropharynx Cardiovascular:     Rate and Rhythm: Normal rate and regular rhythm.     Heart sounds: Normal heart sounds. No murmur heard.   Pulmonary:     Effort: Pulmonary effort is normal.     Breath sounds: Rhonchi present. No wheezing or rales.     Comments: Scattered rhonchi partially cleared with cough Lymphadenopathy:     Head:     Right side of head: No submental, submandibular or tonsillar adenopathy.     Left side of head: No submental, submandibular or tonsillar adenopathy.     Cervical: No cervical adenopathy.  Psychiatric:        Behavior: Behavior is cooperative.      UC Treatments / Results  Labs (all labs ordered are listed, but only abnormal results are displayed) Labs Reviewed - No data to display  EKG   Radiology No results found.  Procedures Procedures (including critical care time)  Medications Ordered in UC Medications - No data to display  Initial Impression / Assessment and Plan / UC Course  I have reviewed the triage vital signs and the nursing notes.  Pertinent labs & imaging results that were  available during my care of the patient were reviewed by me and considered in my medical decision making (see chart for details).     Patient started on Omnicef given prolonged and worsening symptoms.  She was given prednisone burst with instruction not to take NSAIDs with this medication due to risk of GI bleeding.  Recommended she use over-the-counter medications including Mucinex and Flonase for symptom relief.  She was prescribed meclizine DM for cough with instruction to drive or drink alcohol with this medication as drowsiness is a common side effect.  Encouraged her to drink plenty of fluid.  Discussed alarm symptoms that warrant emergent evaluation.  Strict return precautions given to which patient expressed understanding.  Final Clinical Impressions(s) / UC Diagnoses   Final diagnoses:  Sinobronchitis  Productive cough     Discharge Instructions     Take Omnicef twice daily to cover for sinus infection.  Take prednisone (2 tablets) in the morning for 4 days.  You should not take NSAIDs (aspirin, ibuprofen/Advil, naproxen/Aleve) with this medication as it can cause stomach bleeding.  You can use Promethazine DM for cough but this will make you sleepy do not drive or drink alcohol with it.  Please use Mucinex and Flonase for symptom relief.  Make sure you are drinking plenty of fluid.  If symptoms do not improve with this treatment or if anything worsens please return for reevaluation.    ED Prescriptions    Medication Sig Dispense Auth. Provider   cefdinir (OMNICEF) 300 MG capsule Take 1 capsule (300 mg total) by mouth 2 (two) times daily. 20 capsule Copper Basnett K, PA-C   predniSONE (DELTASONE) 20 MG tablet Take 2 tablets (40 mg total) by mouth daily with breakfast for 4 days. 8 tablet Sophiea Ueda K, PA-C   promethazine-dextromethorphan (PROMETHAZINE-DM) 6.25-15 MG/5ML syrup Take 5 mLs by mouth 2 (two) times daily as needed for cough. 118 mL Gates Jividen K, PA-C     PDMP not  reviewed this encounter.   Jeani Hawking, PA-C 06/25/20 1916    RaspetNoberto Retort, PA-C 06/25/20 1919

## 2020-06-25 NOTE — ED Triage Notes (Signed)
Patient c/o congestion, runny nose, headache, sinus pressure and pain.  Patient has taken Nyquil, Dayquil, OTC cough meds.  Patient is not vaccinated for COVID.

## 2020-07-02 ENCOUNTER — Encounter (HOSPITAL_COMMUNITY): Payer: Self-pay

## 2020-07-02 ENCOUNTER — Ambulatory Visit (HOSPITAL_COMMUNITY)
Admission: EM | Admit: 2020-07-02 | Discharge: 2020-07-02 | Disposition: A | Discharge status: Home / Self Care | Payer: Medicaid Other | Attending: Urgent Care | Admitting: Urgent Care

## 2020-07-02 ENCOUNTER — Ambulatory Visit (INDEPENDENT_AMBULATORY_CARE_PROVIDER_SITE_OTHER): Payer: Medicaid Other

## 2020-07-02 ENCOUNTER — Other Ambulatory Visit: Payer: Self-pay

## 2020-07-02 DIAGNOSIS — F17200 Nicotine dependence, unspecified, uncomplicated: Secondary | ICD-10-CM | POA: Insufficient documentation

## 2020-07-02 DIAGNOSIS — R059 Cough, unspecified: Secondary | ICD-10-CM

## 2020-07-02 DIAGNOSIS — Z20822 Contact with and (suspected) exposure to covid-19: Secondary | ICD-10-CM | POA: Insufficient documentation

## 2020-07-02 DIAGNOSIS — J019 Acute sinusitis, unspecified: Secondary | ICD-10-CM | POA: Diagnosis not present

## 2020-07-02 DIAGNOSIS — Z28310 Unvaccinated for covid-19: Secondary | ICD-10-CM | POA: Diagnosis not present

## 2020-07-02 DIAGNOSIS — R070 Pain in throat: Secondary | ICD-10-CM | POA: Diagnosis not present

## 2020-07-02 MED ORDER — AMOXICILLIN-POT CLAVULANATE 875-125 MG PO TABS: 1.0000 | ORAL_TABLET | Freq: Two times a day (BID) | ORAL | 0 refills | Status: DC

## 2020-07-02 MED ORDER — CETIRIZINE HCL 10 MG PO TABS: 10.0000 mg | ORAL_TABLET | Freq: Every day | ORAL | 0 refills | Status: DC

## 2020-07-02 MED ORDER — PSEUDOEPHEDRINE HCL 60 MG PO TABS: 60.0000 mg | ORAL_TABLET | Freq: Three times a day (TID) | ORAL | 0 refills | Status: DC | PRN

## 2020-07-02 MED ORDER — PROMETHAZINE-DM 6.25-15 MG/5ML PO SYRP: 5.0000 mL | ORAL_SOLUTION | Freq: Two times a day (BID) | ORAL | 0 refills | Status: DC | PRN

## 2020-07-02 NOTE — ED Triage Notes (Signed)
Pt in with c/o productive cough, body aches, headache and subjective fever x 3 weeks  Pt has been taking promethazine cough syrup and a prescribed steroid with no relief

## 2020-07-02 NOTE — ED Provider Notes (Signed)
[facility]   MRN: [id number] DOB: 1985-03-19  Subjective:   [facility] Ruiz is a 35 y.o. female presenting for 1 month history of persistent and worsening productive cough with body aches, headaches and subjective fever, sinus congestion/pressure.  Patient was last seen here on 06/25/2020 and treated for sinobronchitis with cefdinir, prednisone and cough syrup.  Patient states she completed the treatment course and still feels very ill.  She is not COVID vaccinated, would like to get tested for this today.  No current facility-administered medications for this encounter.  Current Outpatient Medications:  .  acetaminophen (TYLENOL) 325 MG tablet, Take 2 tablets (650 mg total) by mouth every 6 (six) hours as needed., Disp: 30 tablet, Rfl: 0 .  cefdinir (OMNICEF) 300 MG capsule, Take 1 capsule (300 mg total) by mouth 2 (two) times daily., Disp: 20 capsule, Rfl: 0 .  diclofenac Sodium (VOLTAREN) 1 % GEL, Apply 4 g topically 4 (four) times daily., Disp: 100 g, Rfl: 0 .  ibuprofen (ADVIL) 600 MG tablet, Take 1 tablet (600 mg total) by mouth 3 (three) times daily., Disp: 30 tablet, Rfl: 0 .  promethazine-dextromethorphan (PROMETHAZINE-DM) 6.25-15 MG/5ML syrup, Take 5 mLs by mouth 2 (two) times daily as needed for cough., Disp: 118 mL, Rfl: 0   No Known Allergies  History reviewed. No pertinent past medical history.   History reviewed. No pertinent surgical history.  History reviewed. No pertinent family history.  Social History   Tobacco Use  . Smoking status: Current Some Day Smoker  . Smokeless tobacco: Never Used  Vaping Use  . Vaping Use: Every day  Substance Use Topics  . Alcohol use: Yes    Comment: occasionally  . Drug use: Never    ROS   Objective:   Vitals: BP (!) 137/92 (BP Location: Left Arm)   Pulse (!) 103   Temp 99.2 F (37.3 C) (Oral)   Resp 17   LMP 06/04/2020 (Approximate)   SpO2 98%   Physical Exam Constitutional:      General:  She is not in acute distress.    Appearance: Normal appearance. She is well-developed. She is not ill-appearing, toxic-appearing or diaphoretic.  HENT:     Head: Normocephalic and atraumatic.     Nose: Nose normal.     Mouth/Throat:     Mouth: Mucous membranes are moist.  Eyes:     Extraocular Movements: Extraocular movements intact.     Pupils: Pupils are equal, round, and reactive to light.  Cardiovascular:     Rate and Rhythm: Normal rate and regular rhythm.     Pulses: Normal pulses.     Heart sounds: Normal heart sounds. No murmur heard. No friction rub. No gallop.   Pulmonary:     Effort: Pulmonary effort is normal. No respiratory distress.     Breath sounds: No stridor. No wheezing, rhonchi or rales.     Comments: Slightly decreased lung sounds in bibasilar fields. Skin:    General: Skin is warm and dry.     Findings: No rash.  Neurological:     Mental Status: She is alert and oriented to person, place, and time.  Psychiatric:        Mood and Affect: Mood normal.        Behavior: Behavior normal.        Thought Content: Thought content normal.     DG Chest 2 View  Result Date: 07/02/2020 CLINICAL DATA:  Productive cough, body aches, headache EXAM:  CHEST - 2 VIEW COMPARISON:  None. FINDINGS: The heart size and mediastinal contours are within normal limits. Both lungs are clear. The visualized skeletal structures are unremarkable. IMPRESSION: Negative. Electronically Signed   By: Charlett Nose M.D.   On: 07/02/2020 18:21    Assessment and Plan :   PDMP not reviewed this encounter.  1. Acute non-recurrent sinusitis, unspecified location   2. Cough   3. Throat pain     Will start empiric treatment for sinusitis with Augmentin.  Recommended supportive care otherwise including the use of oral antihistamine, decongestant. Counseled patient on potential for adverse effects with medications prescribed/recommended today, ER and return-to-clinic precautions discussed, patient  verbalized understanding.    Wallis Bamberg, [place] 07/02/20 [phone]

## 2020-07-02 NOTE — Discharge Instructions (Signed)
We will notify you of your COVID-19 test results as they arrive and may take between 24 to 48 hours.  I encourage you to sign up for MyChart if you have not already done so as this can be the easiest way for [place] to communicate results to you online or through a phone app.  In the meantime, if you develop worsening symptoms including fever, chest pain, shortness of breath despite our current treatment plan then please report to the emergency room as this may be a sign of worsening status from possible COVID-19 infection.  Otherwise, we will manage this as a sinus infection with Augmentin. For sore throat or cough try using a honey-based tea. Use 3 teaspoons of honey with juice squeezed from half lemon. Place shaved pieces of ginger into 1/2-1 cup of water and warm over stove top. Then mix the ingredients and repeat every 4 hours as needed. Please take Tylenol 500mg -650mg  every 6 hours for aches and pains, fevers. Hydrate very well with at least 2 liters of water. Eat light meals such as soups to replenish electrolytes and soft fruits, veggies. Start an antihistamine like Zyrtec, Allegra or Claritin for postnasal drainage, sinus congestion.  You can take this together with pseudoephedrine (Sudafed) at a dose of 60 mg 2-3 times a day as needed for the same kind of congestion.

## 2020-07-03 LAB — SARS CORONAVIRUS 2 (TAT 6-24 HRS): SARS Coronavirus 2: NEGATIVE

## 2020-09-21 ENCOUNTER — Ambulatory Visit (HOSPITAL_COMMUNITY)
Admission: EM | Admit: 2020-09-21 | Discharge: 2020-09-21 | Disposition: A | Discharge status: Home / Self Care | Payer: Medicaid Other | Attending: Family Medicine | Admitting: Family Medicine

## 2020-09-21 ENCOUNTER — Encounter (HOSPITAL_COMMUNITY): Payer: Self-pay | Admitting: Emergency Medicine

## 2020-09-21 ENCOUNTER — Other Ambulatory Visit: Payer: Self-pay

## 2020-09-21 DIAGNOSIS — R21 Rash and other nonspecific skin eruption: Secondary | ICD-10-CM | POA: Diagnosis not present

## 2020-09-21 MED ORDER — TRIAMCINOLONE ACETONIDE 0.1 % EX CREA: 1.0000 "application " | TOPICAL_CREAM | Freq: Two times a day (BID) | CUTANEOUS | 0 refills | Status: DC

## 2020-09-21 MED ORDER — DIPHENHYDRAMINE HCL 25 MG PO TABS: 25.0000 mg | ORAL_TABLET | Freq: Four times a day (QID) | ORAL | 0 refills | Status: DC | PRN

## 2020-09-21 NOTE — ED Triage Notes (Signed)
Pt presents with rash on neck that started yesterday. Denies pain but states does itch.

## 2020-09-21 NOTE — ED Provider Notes (Signed)
[facility]    CSN: [id number] Arrival date & time: 09/21/20  1523      History   Chief Complaint Chief Complaint  Patient presents with   Rash    HPI Paula Ruiz is a 35 y.o. female.   Presenting today with an itchy rash to the neck that started yesterday.  She states its been spreading down onto her chest and upper back since this morning.  She denies any new products use at home, foods eaten, medications tried.  So far not trying anything over-the-counter for symptoms other than Aquaphor which may have made it worse last night.  Denies past history of similar rashes.   History reviewed. No pertinent past medical history.  There are no problems to display for this patient.   History reviewed. No pertinent surgical history.  OB History   No obstetric history on file.      Home Medications    Prior to Admission medications   Medication Sig Start Date End Date Taking? Authorizing Provider  diphenhydrAMINE (BENADRYL) 25 MG tablet Take 1 tablet (25 mg total) by mouth every 6 (six) hours as needed. 09/21/20  Yes Particia Nearing, PA-C  triamcinolone cream (KENALOG) 0.1 % Apply 1 application topically 2 (two) times daily. 09/21/20  Yes Particia Nearing, PA-C  acetaminophen (TYLENOL) 325 MG tablet Take 2 tablets (650 mg total) by mouth every 6 (six) hours as needed. 08/22/19   Darr, Gerilyn Pilgrim, PA-C  amoxicillin-clavulanate (AUGMENTIN) 875-125 MG tablet Take 1 tablet by mouth every 12 (twelve) hours. 07/02/20   Wallis Bamberg, PA-C  cefdinir (OMNICEF) 300 MG capsule Take 1 capsule (300 mg total) by mouth 2 (two) times daily. 06/25/20   Raspet, Noberto Retort, PA-C  cetirizine (ZYRTEC ALLERGY) 10 MG tablet Take 1 tablet (10 mg total) by mouth daily. 07/02/20   Wallis Bamberg, PA-C  diclofenac Sodium (VOLTAREN) 1 % GEL Apply 4 g topically 4 (four) times daily. 08/22/19   Darr, Gerilyn Pilgrim, PA-C  ibuprofen (ADVIL) 600 MG tablet Take 1 tablet (600 mg total) by mouth 3 (three) times daily.  06/08/20   Milagros Loll, MD  promethazine-dextromethorphan (PROMETHAZINE-DM) 6.25-15 MG/5ML syrup Take 5 mLs by mouth 2 (two) times daily as needed for cough. 07/02/20   Wallis Bamberg, PA-C  pseudoephedrine (SUDAFED) 60 MG tablet Take 1 tablet (60 mg total) by mouth every 8 (eight) hours as needed for congestion. 07/02/20   Wallis Bamberg, PA-C    Family History History reviewed. No pertinent family history.  Social History Social History   Tobacco Use   Smoking status: Some Days   Smokeless tobacco: Never  Vaping Use   Vaping Use: Every day  Substance Use Topics   Alcohol use: Yes    Comment: occasionally   Drug use: Never     Allergies   Patient has no known allergies.   Review of Systems Review of Systems Per HPI  Physical Exam Triage Vital Signs ED Triage Vitals  Enc Vitals Group     BP 09/21/20 1641 121/70     Pulse Rate 09/21/20 1641 (!) 46     Resp 09/21/20 1641 16     Temp 09/21/20 1641 98.3 F (36.8 C)     Temp Source 09/21/20 1641 Oral     SpO2 09/21/20 1641 96 %     Weight --      Height --      Head Circumference --      Peak Flow --  Pain Score 09/21/20 1639 0     Pain Loc --      Pain Edu? --      Excl. in GC? --    No data found.  Updated Vital Signs BP 121/70 (BP Location: Right Arm)   Pulse (!) 46   Temp 98.3 F (36.8 C) (Oral)   Resp 16   LMP 08/22/2020   SpO2 96%   Visual Acuity Right Eye Distance:   Left Eye Distance:   Bilateral Distance:    Right Eye Near:   Left Eye Near:    Bilateral Near:     Physical Exam Vitals and nursing note reviewed.  Constitutional:      Appearance: Normal appearance. She is not ill-appearing.  HENT:     Head: Atraumatic.  Eyes:     Extraocular Movements: Extraocular movements intact.     Conjunctiva/sclera: Conjunctivae normal.  Cardiovascular:     Rate and Rhythm: Normal rate and regular rhythm.     Heart sounds: Normal heart sounds.  Pulmonary:     Effort: Pulmonary effort is normal.      Breath sounds: Normal breath sounds.  Musculoskeletal:        General: Normal range of motion.     Cervical back: Normal range of motion and neck supple.  Skin:    General: Skin is warm.     Findings: Rash present.     Comments: Erythematous maculopapular rash sporadic across bilateral neck and chest  Neurological:     Mental Status: She is alert and oriented to person, place, and time.  Psychiatric:        Mood and Affect: Mood normal.        Thought Content: Thought content normal.        Judgment: Judgment normal.     UC Treatments / Results  Labs (all labs ordered are listed, but only abnormal results are displayed) Labs Reviewed - No data to display  EKG   Radiology No results found.  Procedures Procedures (including critical care time)  Medications Ordered in UC Medications - No data to display  Initial Impression / Assessment and Plan / UC Course  I have reviewed the triage vital signs and the nursing notes.  Pertinent labs & imaging results that were available during my care of the patient were reviewed by me and considered in my medical decision making (see chart for details).     Suspect allergic in nature, will give triamcinolone cream and discussed Benadryl, unscented products.  Follow-up with PCP for recheck.  Final Clinical Impressions(s) / UC Diagnoses   Final diagnoses:  Rash   Discharge Instructions   None    ED Prescriptions     Medication Sig Dispense Auth. Provider   triamcinolone cream (KENALOG) 0.1 % Apply 1 application topically 2 (two) times daily. 60 g Particia Nearing, [place]   diphenhydrAMINE (BENADRYL) 25 MG tablet Take 1 tablet (25 mg total) by mouth every 6 (six) hours as needed. 30 tablet Particia Nearing, [place]      PDMP not reviewed this encounter.   Particia Nearing, [place] 09/21/20 1731

## 2021-09-27 ENCOUNTER — Encounter (HOSPITAL_COMMUNITY): Payer: Self-pay | Admitting: *Deleted

## 2021-09-27 ENCOUNTER — Ambulatory Visit (HOSPITAL_COMMUNITY)
Admission: EM | Admit: 2021-09-27 | Discharge: 2021-09-27 | Disposition: A | Discharge status: Home / Self Care | Payer: Medicaid Other | Attending: Emergency Medicine | Admitting: Emergency Medicine

## 2021-09-27 DIAGNOSIS — B029 Zoster without complications: Secondary | ICD-10-CM | POA: Diagnosis present

## 2021-09-27 DIAGNOSIS — Z113 Encounter for screening for infections with a predominantly sexual mode of transmission: Secondary | ICD-10-CM | POA: Diagnosis not present

## 2021-09-27 MED ORDER — VALACYCLOVIR HCL 1 G PO TABS: 1000.0000 mg | ORAL_TABLET | Freq: Three times a day (TID) | ORAL | 0 refills | Status: AC

## 2021-09-27 NOTE — ED Triage Notes (Signed)
Pt states that she she has a rash or bite on her left buttocks x 1 week. She noticed after she returned from the beach. She used some cream she had for a rash she believes it is TAC.

## 2021-09-27 NOTE — Discharge Instructions (Addendum)
Today you are being treated for the blisterlike rash on your buttocks, a swab has been obtained to determine if herpes virus is present within the rash  Take valacyclovir 3 times a day (every 8 hours) for 7 days to reduce the amount of virus in the body to help clear rash, this medicine will be effective if the virus is present  Inside your packet is more information on how to care for your rash while present  Labs pending 2-3 days, you will be contacted if positive for any sti and treatment will be sent to the pharmacy, you will have to return to the clinic if positive for gonorrhea to receive treatment   Please refrain from having sex until labs results, if positive please refrain from having sex until treatment complete and symptoms resolve   If positive for  Chlamydia  gonorrhea or trichomoniasis please notify partner or partners so they may tested as well  Moving forward, it is recommended you use some form of protection against the transmission of sti infections  such as condoms or dental dams with each sexual encounter

## 2021-09-27 NOTE — ED Provider Notes (Signed)
[facility]    CSN: [id number] Arrival date & time: 09/27/21  1644      History   Chief Complaint Chief Complaint  Patient presents with   Insect Bite    HPI Paula Ruiz is a 36 y.o. female.   Patient presents with rash to the left buttocks for 7 days.  Has increased in size and is tender to palpation.  Dors is rash is pimple-like with Morgaine Kimball centering.  has attempted use of triamcinolone cream which has not been helpful. Denies drainage, fever or chills, exposure to new lotions, soaps detergents, recent travel or dietary changes.  Sexually active.  Concern for herpes no prior history.  History reviewed. No pertinent past medical history.  There are no problems to display for this patient.   History reviewed. No pertinent surgical history.  OB History   No obstetric history on file.      Home Medications    Prior to Admission medications   Medication Sig Start Date End Date Taking? Authorizing Provider  triamcinolone cream (KENALOG) 0.1 % Apply 1 application topically 2 (two) times daily. 09/21/20  Yes Particia Nearing, PA-C  valACYclovir (VALTREX) 1000 MG tablet Take 1 tablet (1,000 mg total) by mouth 3 (three) times daily for 7 days. 09/27/21 10/04/21 Yes Evangelyn Crouse, Elita Boone, NP  acetaminophen (TYLENOL) 325 MG tablet Take 2 tablets (650 mg total) by mouth every 6 (six) hours as needed. 08/22/19   Darr, Gerilyn Pilgrim, PA-C  amoxicillin-clavulanate (AUGMENTIN) 875-125 MG tablet Take 1 tablet by mouth every 12 (twelve) hours. 07/02/20   Wallis Bamberg, PA-C  cefdinir (OMNICEF) 300 MG capsule Take 1 capsule (300 mg total) by mouth 2 (two) times daily. 06/25/20   Raspet, Noberto Retort, PA-C  cetirizine (ZYRTEC ALLERGY) 10 MG tablet Take 1 tablet (10 mg total) by mouth daily. 07/02/20   Wallis Bamberg, PA-C  diclofenac Sodium (VOLTAREN) 1 % GEL Apply 4 g topically 4 (four) times daily. 08/22/19   Darr, Gerilyn Pilgrim, PA-C  diphenhydrAMINE (BENADRYL) 25 MG tablet Take 1 tablet (25 mg total) by  mouth every 6 (six) hours as needed. 09/21/20   Particia Nearing, PA-C  ibuprofen (ADVIL) 600 MG tablet Take 1 tablet (600 mg total) by mouth 3 (three) times daily. 06/08/20   Milagros Loll, MD  promethazine-dextromethorphan (PROMETHAZINE-DM) 6.25-15 MG/5ML syrup Take 5 mLs by mouth 2 (two) times daily as needed for cough. 07/02/20   Wallis Bamberg, PA-C  pseudoephedrine (SUDAFED) 60 MG tablet Take 1 tablet (60 mg total) by mouth every 8 (eight) hours as needed for congestion. 07/02/20   Wallis Bamberg, PA-C    Family History History reviewed. No pertinent family history.  Social History Social History   Tobacco Use   Smoking status: Some Days   Smokeless tobacco: Never  Vaping Use   Vaping Use: Every day  Substance Use Topics   Alcohol use: Yes    Comment: occasionally   Drug use: Never     Allergies   Patient has no known allergies.   Review of Systems Review of Systems  Constitutional: Negative.   Respiratory: Negative.    Cardiovascular: Negative.   Skin:  Positive for rash. Negative for color change and wound.  Neurological: Negative.      Physical Exam Triage Vital Signs ED Triage Vitals  Enc Vitals Group     BP 09/27/21 1732 (!) 134/91     Pulse Rate 09/27/21 1732 81     Resp 09/27/21 1732 18  Temp 09/27/21 1732 98.6 F (37 C)     Temp Source 09/27/21 1732 Oral     SpO2 09/27/21 1732 100 %     Weight --      Height --      Head Circumference --      Peak Flow --      Pain Score 09/27/21 1730 0     Pain Loc --      Pain Edu? --      Excl. in GC? --    No data found.  Updated Vital Signs BP (!) 134/91 (BP Location: Right Arm)   Pulse 81   Temp 98.6 F (37 C) (Oral)   Resp 18   LMP 08/31/2021 (Approximate)   SpO2 100%   Visual Acuity Right Eye Distance:   Left Eye Distance:   Bilateral Distance:    Right Eye Near:   Left Eye Near:    Bilateral Near:     Physical Exam Constitutional:      Appearance: Normal appearance.  Eyes:      Extraocular Movements: Extraocular movements intact.  Pulmonary:     Effort: Pulmonary effort is normal.  Skin:    Comments: Refer to photo below  Rashes present only to the left buttocks, no lesions noted to the genitalia  Neurological:     Mental Status: She is alert and oriented to person, place, and time. Mental status is at baseline.  Psychiatric:        Mood and Affect: Mood normal.        Behavior: Behavior normal.       UC Treatments / Results  Labs (all labs ordered are listed, but only abnormal results are displayed) Labs Reviewed  HSV CULTURE AND TYPING  CERVICOVAGINAL ANCILLARY ONLY    EKG   Radiology No results found.  Procedures Procedures (including critical care time)  Medications Ordered in UC Medications - No data to display  Initial Impression / Assessment and Plan / UC Course  I have reviewed the triage vital signs and the nursing notes.  Pertinent labs & imaging results that were available during my care of the patient were reviewed by me and considered in my medical decision making (see chart for details).  Herpes zoster without complication  Due to placement we will proceed with treatment for shingles no lesions are noted to the anal region nor genitalia, valacyclovir prescribed, HSV swab obtained, pending, STI labs are pending, will treat further per protocol, advised abstinence until lab results, treatment is complete and all symptoms have resolved, given written handout on blister care for home management, may follow-up with urgent care as needed Final Clinical Impressions(s) / UC Diagnoses   Final diagnoses:  Herpes zoster without complication   Discharge Instructions   None    ED Prescriptions     Medication Sig Dispense Auth. Provider   valACYclovir (VALTREX) 1000 MG tablet Take 1 tablet (1,000 mg total) by mouth 3 (three) times daily for 7 days. 21 tablet Amarisa Wilinski, Elita Boone, NP      PDMP not reviewed this encounter.   Valinda Hoar, [place] 09/27/21 [phone]

## 2021-09-28 LAB — CERVICOVAGINAL ANCILLARY ONLY
Bacterial Vaginitis (gardnerella): POSITIVE — AB
Candida Glabrata: NEGATIVE
Candida Vaginitis: NEGATIVE
Chlamydia: NEGATIVE
Comment: NEGATIVE
Comment: NEGATIVE
Comment: NEGATIVE
Comment: NEGATIVE
Comment: NEGATIVE
Comment: NORMAL
Neisseria Gonorrhea: NEGATIVE
Trichomonas: NEGATIVE

## 2021-09-30 LAB — HSV CULTURE AND TYPING

## 2021-10-01 ENCOUNTER — Telehealth (HOSPITAL_COMMUNITY): Payer: Self-pay | Admitting: Emergency Medicine

## 2021-10-01 MED ORDER — METRONIDAZOLE 500 MG PO TABS: 500.0000 mg | ORAL_TABLET | Freq: Two times a day (BID) | ORAL | 0 refills | Status: DC

## 2022-05-19 ENCOUNTER — Encounter (HOSPITAL_COMMUNITY): Payer: Self-pay

## 2022-05-19 ENCOUNTER — Ambulatory Visit (HOSPITAL_COMMUNITY)
Admission: EM | Admit: 2022-05-19 | Discharge: 2022-05-19 | Disposition: A | Discharge status: Home / Self Care | Payer: Medicaid Other | Attending: Urgent Care | Admitting: Urgent Care

## 2022-05-19 DIAGNOSIS — Z202 Contact with and (suspected) exposure to infections with a predominantly sexual mode of transmission: Secondary | ICD-10-CM

## 2022-05-19 LAB — POCT URINALYSIS DIP (MANUAL ENTRY)
Bilirubin, UA: NEGATIVE
Glucose, UA: NEGATIVE mg/dL
Ketones, POC UA: NEGATIVE mg/dL
Leukocytes, UA: NEGATIVE
Nitrite, UA: NEGATIVE
Protein Ur, POC: NEGATIVE mg/dL
Spec Grav, UA: >=1.03 — AB (ref 1.010–1.025)
Urobilinogen, UA: 0.2 E.U./dL
pH, UA: 6 (ref 5.0–8.0)

## 2022-05-19 MED ORDER — CEFTRIAXONE SODIUM 500 MG IJ SOLR
INTRAMUSCULAR | Status: AC
  Filled 2022-05-19: qty 500

## 2022-05-19 MED ORDER — CEFTRIAXONE SODIUM 500 MG IJ SOLR
500.0000 mg | Freq: Once | INTRAMUSCULAR | Status: AC
  Administered 2022-05-19: 500 mg via INTRAMUSCULAR

## 2022-05-19 NOTE — ED Provider Notes (Signed)
[facility]    CSN: [id number] Arrival date & time: 05/19/22  1621      History   Chief Complaint Chief Complaint  Patient presents with   Exposure to STD    HPI Paula Ruiz is a 37 y.o. female.   37 year old female presents today due to concern of exposure to gonorrhea.  States her partner just tested positive this week, her last sexual contact with him was Sunday and Monday of this week.  She reports some lower pelvic cramping, but states her menstrual period is supposed to start in the next 1 to 2 days.  She denies any severe pelvic pain, vaginal discharge, itching, dysuria, or hematuria.  She denies any rash or lesions.  She is requesting a shot for gonorrhea as this is what her partner was given.  Denies any additional complaints.   Exposure to STD    History reviewed. No pertinent past medical history.  There are no problems to display for this patient.   History reviewed. No pertinent surgical history.  OB History   No obstetric history on file.      Home Medications    Prior to Admission medications   Medication Sig Start Date End Date Taking? Authorizing Provider  valACYclovir (VALTREX) 500 MG tablet Take 500 mg by mouth 2 (two) times daily. prn   Yes [provider]    Family History History reviewed. No pertinent family history.  Social History Social History   Tobacco Use   Smoking status: Some Days   Smokeless tobacco: Never  Vaping Use   Vaping Use: Every day  Substance Use Topics   Alcohol use: Yes    Comment: occasionally   Drug use: Never     Allergies   Patient has no known allergies.   Review of Systems Review of Systems Per HPI  Physical Exam Triage Vital Signs ED Triage Vitals [05/19/22 1701]  Enc Vitals Group     BP 135/85     Pulse Rate 69     Resp 16     Temp 97.9 F (36.6 C)     Temp Source Oral     SpO2 98 %     Weight      Height      Head Circumference      Peak Flow      Pain  Score      Pain Loc      Pain Edu?      Excl. in GC?    No data found.  Updated Vital Signs BP 135/85 (BP Location: Left Arm)   Pulse 69   Temp 97.9 F (36.6 C) (Oral)   Resp 16   SpO2 98%   Visual Acuity Right Eye Distance:   Left Eye Distance:   Bilateral Distance:    Right Eye Near:   Left Eye Near:    Bilateral Near:     Physical Exam Vitals and nursing note reviewed.  Constitutional:      General: She is not in acute distress.    Appearance: She is well-developed.  HENT:     Head: Normocephalic and atraumatic.  Eyes:     Conjunctiva/sclera: Conjunctivae normal.  Cardiovascular:     Rate and Rhythm: Normal rate and regular rhythm.  Pulmonary:     Effort: Pulmonary effort is normal. No respiratory distress.     Breath sounds: Normal breath sounds.  Abdominal:     General: Abdomen is flat. Bowel sounds are normal. There  is no distension.     Palpations: Abdomen is soft.     Tenderness: There is no abdominal tenderness. There is no right CVA tenderness, left CVA tenderness, guarding or rebound.  Musculoskeletal:        General: No swelling.     Cervical back: Neck supple. No rigidity.  Lymphadenopathy:     Cervical: No cervical adenopathy.  Skin:    General: Skin is warm and dry.     Capillary Refill: Capillary refill takes less than 2 seconds.  Neurological:     General: No focal deficit present.     Mental Status: She is alert and oriented to person, place, and time.  Psychiatric:        Mood and Affect: Mood normal.      UC Treatments / Results  Labs (all labs ordered are listed, but only abnormal results are displayed) Labs Reviewed  POCT URINALYSIS DIP (MANUAL ENTRY) - Abnormal; Notable for the following components:      Result Value   Spec Grav, UA >=1.030 (*)    Blood, UA small (*)    All other components within normal limits  CERVICOVAGINAL ANCILLARY ONLY    EKG   Radiology No results found.  Procedures Procedures (including  critical care time)  Medications Ordered in UC Medications  cefTRIAXone (ROCEPHIN) injection 500 mg (500 mg Intramuscular Given 05/19/22 1822)    Initial Impression / Assessment and Plan / UC Course  I have reviewed the triage vital signs and the nursing notes.  Pertinent labs & imaging results that were available during my care of the patient were reviewed by me and considered in my medical decision making (see chart for details).     Exposure to gonorrhea - pt with known exposure. Will give  IM rocephin today. Aptima swab collected. Pt on verge of menses, likely cause of RBC in urine and pelvic cramping. Pt denies sx of PID. Abstain from intercourse until lab tests obtained.   Final Clinical Impressions(s) / UC Diagnoses   Final diagnoses:  Exposure to gonorrhea     Discharge Instructions      You were given an injection of an antibiotic in our office. This will only treat gonorrhea. We will also test you for chlamydia, BV, trichomonas and yeast. If any other positive results noted, will add additional medication.  Please abstain from all forms of intercourse until test results obtained. All partners must be adequately treated prior to resuming intercourse.      ED Prescriptions   None    PDMP not reviewed this encounter.   Maretta Bees, [place] 05/19/22 1825

## 2022-05-19 NOTE — ED Triage Notes (Signed)
Pt is here for STD- testing. Denies any symptoms.  

## 2022-05-19 NOTE — Discharge Instructions (Addendum)
You were given an injection of an antibiotic in our office. This will only treat gonorrhea. We will also test you for chlamydia, BV, trichomonas and yeast. If any other positive results noted, will add additional medication.  Please abstain from all forms of intercourse until test results obtained. All partners must be adequately treated prior to resuming intercourse.

## 2022-05-20 LAB — CERVICOVAGINAL ANCILLARY ONLY
Bacterial Vaginitis (gardnerella): NEGATIVE
Candida Glabrata: NEGATIVE
Candida Vaginitis: POSITIVE — AB
Chlamydia: NEGATIVE
Comment: NEGATIVE
Comment: NEGATIVE
Comment: NEGATIVE
Comment: NEGATIVE
Comment: NEGATIVE
Comment: NORMAL
Neisseria Gonorrhea: NEGATIVE
Trichomonas: NEGATIVE

## 2022-05-21 ENCOUNTER — Telehealth (HOSPITAL_COMMUNITY): Payer: Self-pay | Admitting: Emergency Medicine

## 2022-05-21 MED ORDER — FLUCONAZOLE 150 MG PO TABS: 150.0000 mg | ORAL_TABLET | Freq: Once | ORAL | 0 refills | Status: AC

## 2022-09-13 IMAGING — US US PELVIS COMPLETE
1 series · 13 of 25 positions shown · non-contrast
Comparison: None.

CLINICAL DATA: Initial evaluation for acute pelvic pain and
bleeding.

EXAM:
TRANSABDOMINAL AND TRANSVAGINAL ULTRASOUND OF PELVIS
DOPPLER ULTRASOUND OF OVARIES
TECHNIQUE: Both transabdominal and transvaginal ultrasound examinations of the
pelvis were performed. Transabdominal technique was performed for
global imaging of the pelvis including uterus, ovaries, adnexal
regions, and pelvic cul-de-sac.
It was necessary to proceed with endovaginal exam following the
transabdominal exam to visualize the uterus, endometrium, and
ovaries. Color and duplex Doppler ultrasound was utilized to
evaluate blood flow to the ovaries.

[Series 1: us pelvis (transabdominal only) · 13 of 124 slices shown]
[im 1/124]
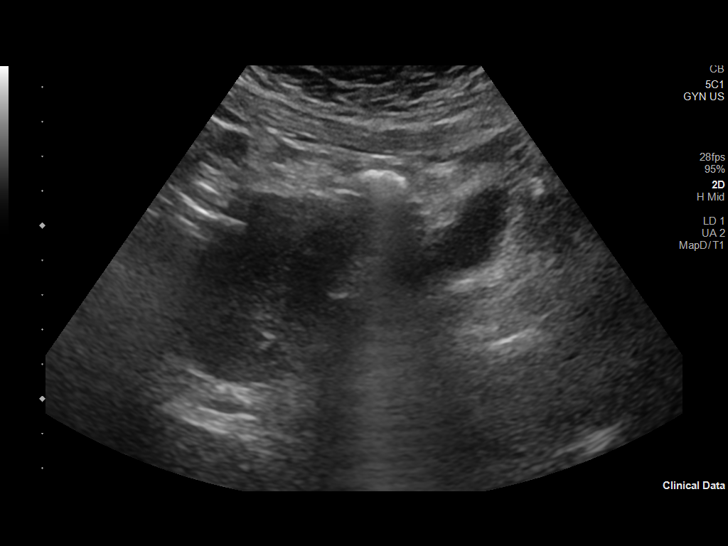
[im 11/124]
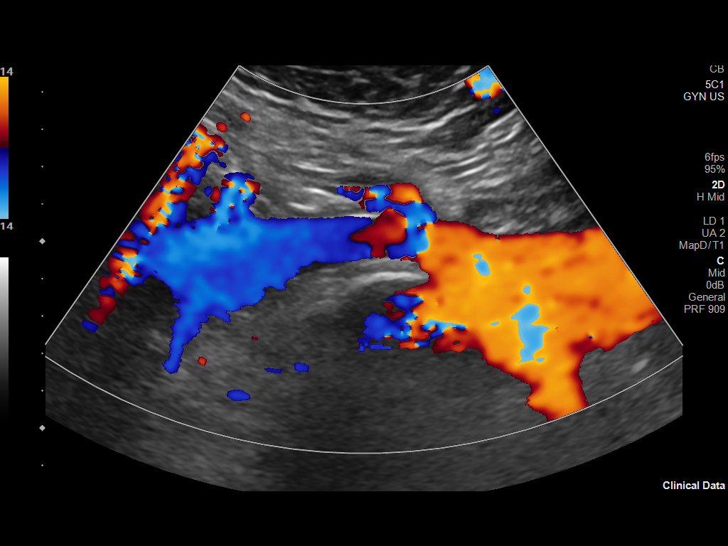
[im 21/124]
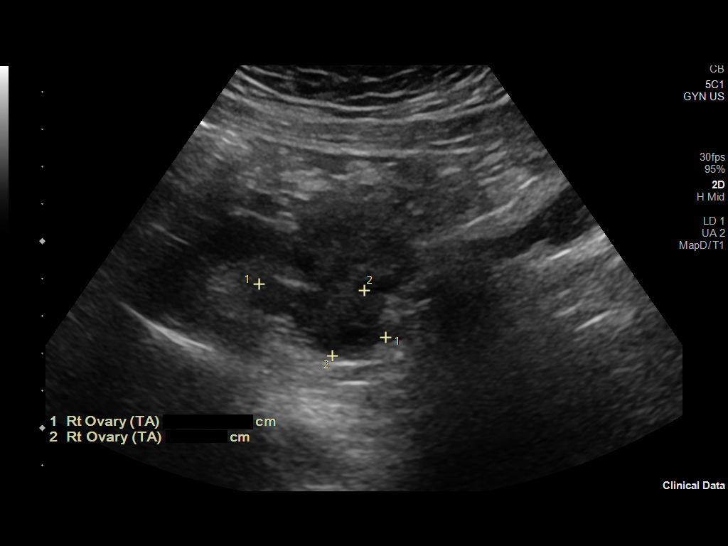
[im 31/124]
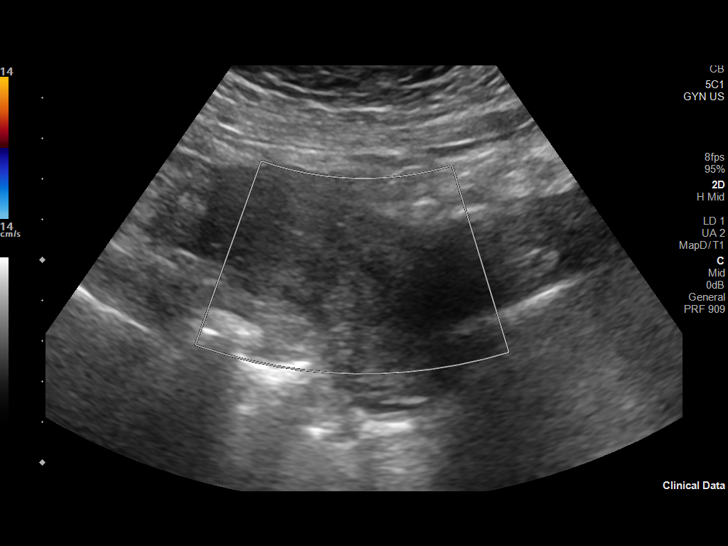
[im 42/124]
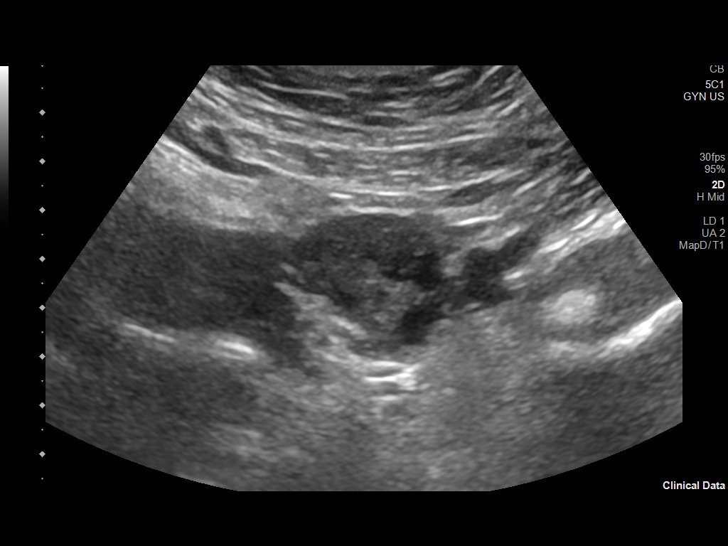
[im 52/124]
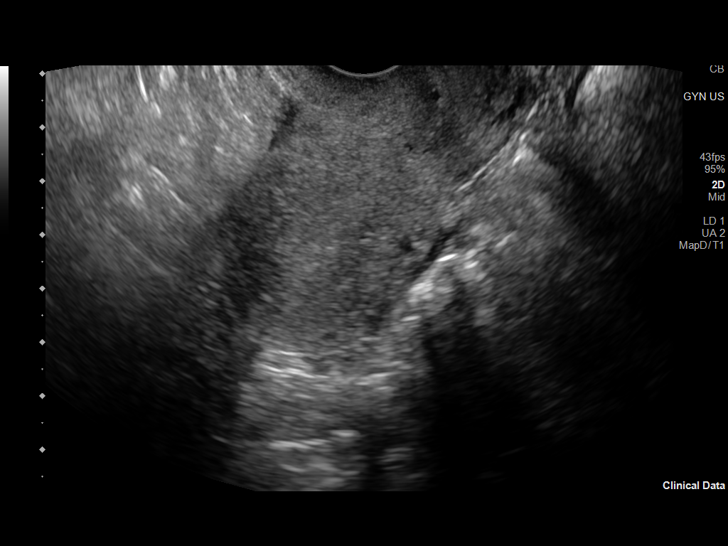
[im 62/124]
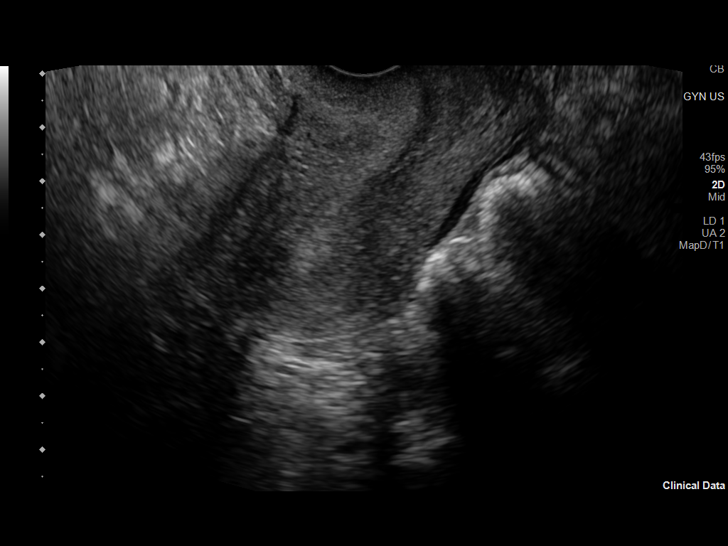
[im 72/124]
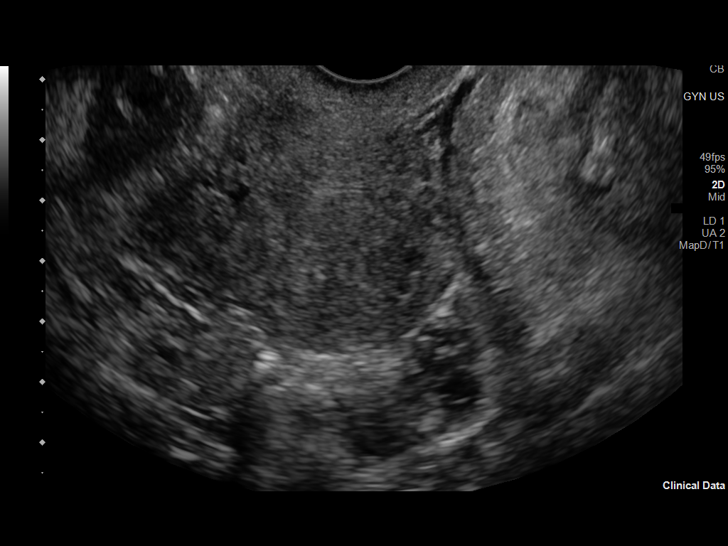
[im 83/124]
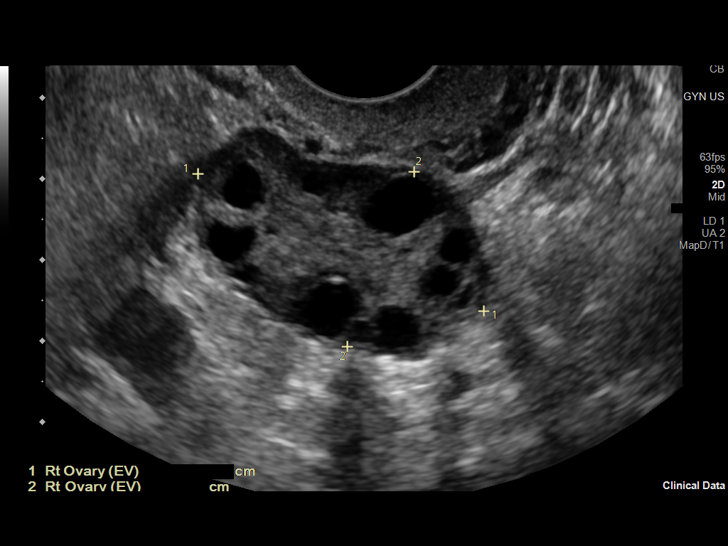
[im 93/124]
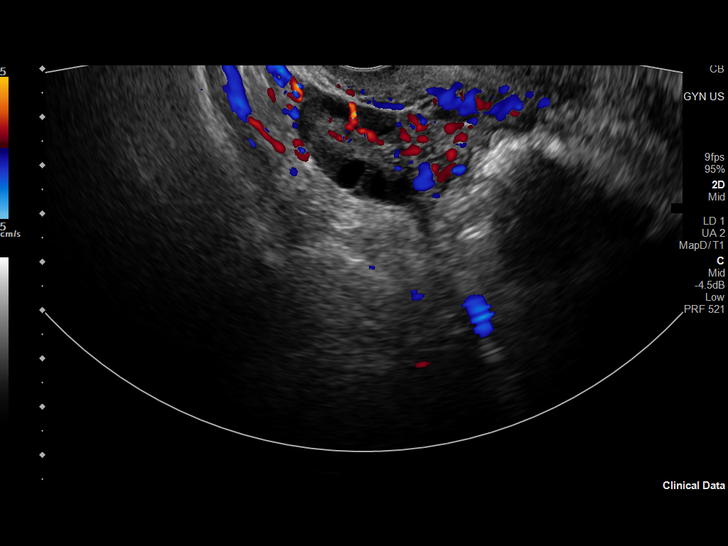
[im 103/124]
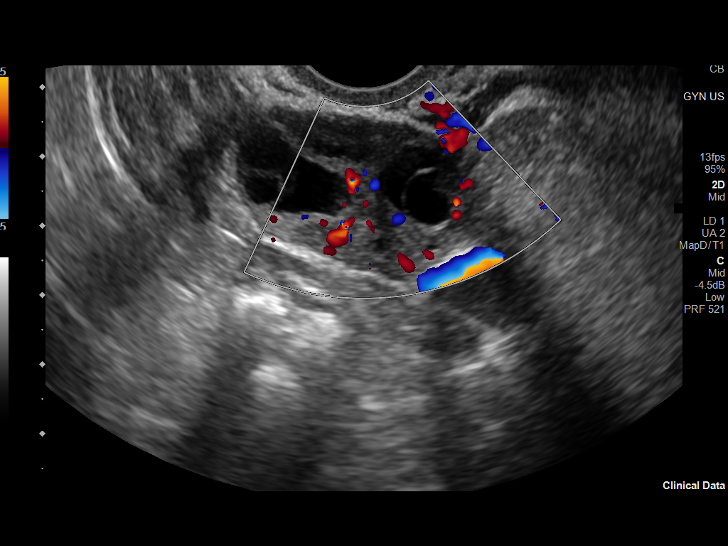
[im 113/124]
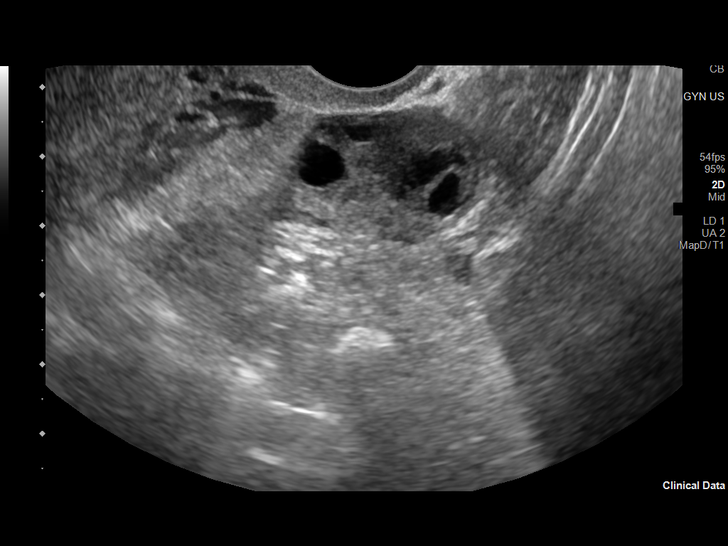
[im 124/124]
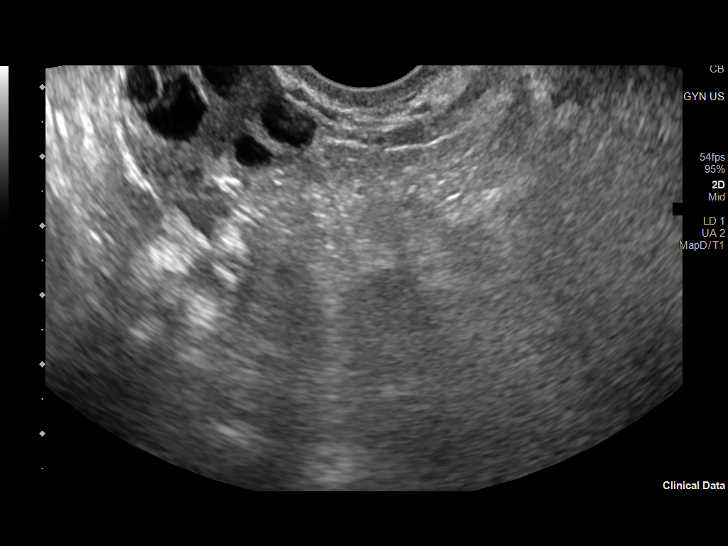

[13 of 25 positions shown; findings below may reference images not displayed]

FINDINGS: Uterus

Measurements: 7.9 x 3.8 x 4.1 cm = volume: 63.3 mL. Uterus is
anteverted. No discrete fibroid or other mass.

Endometrium

Thickness: 6.6 mm.  No focal abnormality visualized.

Right ovary

Measurements: 3.9 x 2.3 x 2.2 cm = volume: 10.4 mL. Normal
appearance/no adnexal mass.

Left ovary

Measurements: 3.8 x 2.1 x 2.2 cm = volume: 9.3 mL. Normal
appearance/no adnexal mass.

Pulsed Doppler evaluation of both ovaries demonstrates normal
low-resistance arterial and venous waveforms.

Other findings

No abnormal free fluid.
IMPRESSION: 1. Normal pelvic ultrasound. No evidence for torsion or other acute
abnormality.
2. Endometrial stripe measures 6.6 mm in thickness. If bleeding
remains unresponsive to hormonal or medical therapy, sonohysterogram
should be considered for focal lesion work-up. (Ref: Radiological
Reasoning: Algorithmic Workup of Abnormal Vaginal Bleeding with
Endovaginal Sonography and Sonohysterography. AJR 8117; 191:S68-73).

## 2023-01-05 ENCOUNTER — Ambulatory Visit (HOSPITAL_COMMUNITY)
Admission: EM | Admit: 2023-01-05 | Discharge: 2023-01-05 | Disposition: A | Discharge status: Home / Self Care | Payer: Medicaid Other | Attending: Family Medicine | Admitting: Family Medicine

## 2023-01-05 ENCOUNTER — Encounter (HOSPITAL_COMMUNITY): Payer: Self-pay

## 2023-01-05 DIAGNOSIS — Z202 Contact with and (suspected) exposure to infections with a predominantly sexual mode of transmission: Secondary | ICD-10-CM | POA: Insufficient documentation

## 2023-01-05 DIAGNOSIS — N898 Other specified noninflammatory disorders of vagina: Secondary | ICD-10-CM | POA: Diagnosis present

## 2023-01-05 LAB — HIV ANTIBODY (ROUTINE TESTING W REFLEX): HIV Screen 4th Generation wRfx: NONREACTIVE

## 2023-01-05 MED ORDER — CEFTRIAXONE SODIUM 500 MG IJ SOLR
500.0000 mg | Freq: Once | INTRAMUSCULAR | Status: AC
  Administered 2023-01-05: 500 mg via INTRAMUSCULAR

## 2023-01-05 MED ORDER — DOXYCYCLINE HYCLATE 100 MG PO CAPS: 100.0000 mg | ORAL_CAPSULE | Freq: Two times a day (BID) | ORAL | 0 refills | Status: AC

## 2023-01-05 MED ORDER — CEFTRIAXONE SODIUM 500 MG IJ SOLR
INTRAMUSCULAR | Status: AC
  Filled 2023-01-05: qty 500

## 2023-01-05 NOTE — Discharge Instructions (Signed)

## 2023-01-05 NOTE — ED Triage Notes (Signed)
Pt presents with vaginal discharge. Request testing with blood work.

## 2023-01-05 NOTE — ED Provider Notes (Signed)
  [facility]   [id number] 01/05/23 Arrival Time: 1740  ASSESSMENT & PLAN:  1. STD exposure   2. Vaginal discharge       Discharge Instructions      You have been given the following today for treatment of suspected gonorrhea and/or chlamydia:  cefTRIAXone (ROCEPHIN) injection 500 mg  Please pick up your prescription for doxycycline 100 mg and begin taking twice daily for the next seven (7) days.  Even though we have treated you today, we have sent testing for sexually transmitted infections. We will notify you of any positive results once they are received. If required, we will prescribe any medications you might need.  Please refrain from all sexual activity for at least the next seven days.     Without s/s of PID.  Labs Pending:  RPR  HIV ANTIBODY (ROUTINE TESTING W REFLEX)  CERVICOVAGINAL ANCILLARY ONLY   Will notify of any positive results. Instructed to refrain from sexual activity for at least seven days.  Reviewed expectations re: course of current medical issues. Questions answered. Outlined signs and symptoms indicating need for more acute intervention. Patient verbalized understanding. After Visit Summary given.   SUBJECTIVE:  Paula Ruiz is a 37 y.o. female who presents with complaint of vaginal discharge. Partner informed he was treated for an STD but she is unsure what. With some vaginal discharge. Few days. Denies fever/pelvic pain.   Patient's last menstrual period was 12/21/2022 (approximate).   OBJECTIVE:  Vitals:   01/05/23 1841  BP: 130/78  Pulse: (!) 105  Resp: 18  Temp: 98.2 F (36.8 C)  TempSrc: Oral  SpO2: 98%     General appearance: alert, cooperative, appears stated age and no distress GU: deferred; no exam performed Skin: warm and dry Psychological: alert and cooperative; normal mood and affect.   Labs Reviewed  RPR  HIV ANTIBODY (ROUTINE TESTING W REFLEX)    No Known Allergies  History reviewed. No  pertinent past medical history. History reviewed. No pertinent family history. Social History   Socioeconomic History   Marital status: Single    Spouse name: Not on file   Number of children: Not on file   Years of education: Not on file   Highest education level: Not on file  Occupational History   Not on file  Tobacco Use   Smoking status: Some Days   Smokeless tobacco: Never  Vaping Use   Vaping status: Every Day  Substance and Sexual Activity   Alcohol use: Yes    Comment: occasionally   Drug use: Never   Sexual activity: Yes    Birth control/protection: Condom  Other Topics Concern   Not on file  Social History Narrative   Not on file   Social Determinants of Health   Financial Resource Strain: Not on file  Food Insecurity: Not on file  Transportation Needs: Not on file  Physical Activity: Not on file  Stress: Not on file  Social Connections: Not on file  Intimate Partner Violence: Not on file           Mardella Layman, MD 01/05/23 Ernestina [place]

## 2023-01-06 ENCOUNTER — Telehealth (HOSPITAL_COMMUNITY): Payer: Self-pay

## 2023-01-06 LAB — CERVICOVAGINAL ANCILLARY ONLY
Bacterial Vaginitis (gardnerella): POSITIVE — AB
Candida Glabrata: NEGATIVE
Candida Vaginitis: NEGATIVE
Chlamydia: NEGATIVE
Comment: NEGATIVE
Comment: NEGATIVE
Comment: NEGATIVE
Comment: NEGATIVE
Comment: NEGATIVE
Comment: NORMAL
Neisseria Gonorrhea: NEGATIVE
Trichomonas: NEGATIVE

## 2023-01-06 LAB — RPR: RPR Ser Ql: NONREACTIVE

## 2023-01-06 MED ORDER — METRONIDAZOLE 500 MG PO TABS: 500.0000 mg | ORAL_TABLET | Freq: Two times a day (BID) | ORAL | 0 refills | Status: DC

## 2023-01-06 NOTE — Telephone Encounter (Signed)
Per protocol, pt requires tx with metronidazole. Rx sent to pharmacy on file.

## 2023-01-09 ENCOUNTER — Telehealth (HOSPITAL_COMMUNITY): Payer: Self-pay | Admitting: *Deleted

## 2023-01-09 MED ORDER — METRONIDAZOLE 500 MG PO TABS: 500.0000 mg | ORAL_TABLET | Freq: Two times a day (BID) | ORAL | 0 refills | Status: AC

## 2023-01-09 NOTE — Telephone Encounter (Signed)
Rx resent to [facility], pt aware

## 2023-07-16 ENCOUNTER — Other Ambulatory Visit: Payer: Self-pay

## 2023-07-16 ENCOUNTER — Emergency Department (HOSPITAL_COMMUNITY)
Admission: EM | Admit: 2023-07-16 | Discharge: 2023-07-16 | Disposition: A | Discharge status: Home / Self Care | Attending: Emergency Medicine | Admitting: Emergency Medicine

## 2023-07-16 ENCOUNTER — Encounter (HOSPITAL_COMMUNITY): Payer: Self-pay

## 2023-07-16 DIAGNOSIS — H60501 Unspecified acute noninfective otitis externa, right ear: Secondary | ICD-10-CM

## 2023-07-16 DIAGNOSIS — H6691 Otitis media, unspecified, right ear: Secondary | ICD-10-CM | POA: Insufficient documentation

## 2023-07-16 DIAGNOSIS — H6690 Otitis media, unspecified, unspecified ear: Secondary | ICD-10-CM

## 2023-07-16 DIAGNOSIS — H9201 Otalgia, right ear: Secondary | ICD-10-CM | POA: Diagnosis present

## 2023-07-16 LAB — RESP PANEL BY RT-PCR (RSV, FLU A&B, COVID)  RVPGX2
Influenza A by PCR: NEGATIVE
Influenza B by PCR: NEGATIVE
Resp Syncytial Virus by PCR: NEGATIVE
SARS Coronavirus 2 by RT PCR: NEGATIVE

## 2023-07-16 MED ORDER — OFLOXACIN 0.3 % OP SOLN
5.0000 [drp] | Freq: Every day | OPHTHALMIC | Status: DC
  Administered 2023-07-16: 5 [drp] via OTIC
  Filled 2023-07-16: qty 5

## 2023-07-16 MED ORDER — AMOXICILLIN 875 MG PO TABS: 875.0000 mg | ORAL_TABLET | Freq: Two times a day (BID) | ORAL | 0 refills | Status: AC

## 2023-07-16 NOTE — Discharge Instructions (Addendum)
 Thank you for letting us  evaluate you. We have provided you with antibiotic drop for otitis externa/Swimmer's ear bacterial infection and and inner ear infection. Please take as prescribed. No swimming for next two weeks. I have also provided you with ENT follow up for them to examine your ear following treatment in next 5-10 days  Return to ED if worsening symptoms

## 2023-07-16 NOTE — ED Triage Notes (Signed)
 Pt c/o feeling bad, aching all over, and has a fullness in her ear. Pt denies fevers or cough.

## 2023-07-16 NOTE — ED Provider Notes (Signed)
 [place] [facility] AT [facility] Provider Note   CSN: [id number] Arrival date & time: 07/16/23  1409     Patient presents with: Ear Fullness   Paula Ruiz is a 38 y.o. female presents to ED for evaluation of pressure in right ear. Was swimming last weekend and feels like water is behind ear. Endorses some drainage from right ear. Denies neck pain, fevers, decreased hearing    Ear Fullness Pertinent negatives include no chest pain, no abdominal pain, no headaches and no shortness of breath.       Prior to Admission medications   Medication Sig Start Date End Date Taking? Authorizing Provider  amoxicillin  (AMOXIL ) 875 MG tablet Take 1 tablet (875 mg total) by mouth 2 (two) times daily for 7 days. 07/16/23 07/23/23 Yes Royann Cords, PA  doxycycline  (VIBRAMYCIN ) 100 MG capsule Take 1 capsule (100 mg total) by mouth 2 (two) times daily. 01/05/23   Afton Albright, MD  valACYclovir  (VALTREX ) 500 MG tablet Take 500 mg by mouth 2 (two) times daily. prn    [provider]    Allergies: Patient has no known allergies.    Review of Systems  Constitutional:  Negative for chills, fatigue and fever.  Respiratory:  Negative for cough, chest tightness, shortness of breath and wheezing.   Cardiovascular:  Negative for chest pain and palpitations.  Gastrointestinal:  Negative for abdominal pain, constipation, diarrhea, nausea and vomiting.  Neurological:  Negative for dizziness, seizures, weakness, light-headedness, numbness and headaches.    Updated Vital Signs BP 124/84 (BP Location: Left Arm)   Pulse 75   Temp 98.7 F (37.1 C) (Oral)   Resp 16   Ht 5' 6 (1.676 m)   Wt 77.1 kg   LMP  (LMP Unknown)   SpO2 100%   BMI 27.44 kg/m   Physical Exam Vitals and nursing note reviewed.  Constitutional:      General: She is not in acute distress.    Appearance: Normal appearance. She is not ill-appearing.  HENT:     Head: Normocephalic and atraumatic.      Comments: No tragal TTP. No mastoid swelling nor TTP    Right Ear: Hearing, ear canal and external ear normal. No middle ear effusion. No foreign body. Tympanic membrane is erythematous. Tympanic membrane is not injected, perforated or bulging.     Left Ear: Hearing, tympanic membrane, ear canal and external ear normal.  No middle ear effusion. No foreign body. Tympanic membrane is not injected, erythematous or bulging.     Ears:     Comments: Debris in right ear with erythematous TM. No tragal nor mastoid ttp bilaterally     Mouth/Throat:     Mouth: Mucous membranes are moist.     Pharynx: No oropharyngeal exudate or posterior oropharyngeal erythema.     Comments: Swallows wo difficulty. Maintaining secretions  Eyes:     General: No scleral icterus.       Right eye: No discharge.        Left eye: No discharge.     Conjunctiva/sclera: Conjunctivae normal.    Cardiovascular:     Rate and Rhythm: Normal rate.     Pulses: Normal pulses.  Pulmonary:     Effort: Pulmonary effort is normal. No respiratory distress.     Breath sounds: Normal breath sounds. No stridor. No wheezing or rhonchi.  Chest:     Chest wall: No tenderness.  Abdominal:     General: There is no distension.  Palpations: Abdomen is soft. There is no mass.     Tenderness: There is no abdominal tenderness. There is no guarding.   Musculoskeletal:     Cervical back: Normal range of motion and neck supple. No rigidity or tenderness.  Lymphadenopathy:     Cervical: No cervical adenopathy.   Skin:    General: Skin is warm.     Capillary Refill: Capillary refill takes less than 2 seconds.     Coloration: Skin is not jaundiced or pale.   Neurological:     Mental Status: She is alert. Mental status is at baseline.     (all labs ordered are listed, but only abnormal results are displayed) Labs Reviewed  RESP PANEL BY RT-PCR (RSV, FLU A&B, COVID)  [id number]    EKG: None  Radiology: No results  found.   Procedures   Medications Ordered in the ED  ofloxacin (OCUFLOX) 0.3 % ophthalmic solution 5 drop (5 drops Right EAR Given 07/16/23 1700)                                    Medical Decision Making Risk Prescription drug management.   Patient presents to the ED for concern of ear fullness, this involves an extensive number of treatment options, and is a complaint that carries with it a high risk of complications and morbidity.  The differential diagnosis includes AOM, externa, FB, mastoiditis, URI   Co morbidities that complicate the patient evaluation  None   Additional history obtained:  Additional history obtained from Nursing   External records from outside source obtained and reviewed including triage RN note   Lab Tests:  I Ordered, and personally interpreted labs.  The pertinent results include:   Resp panel neg    Medicines ordered and prescription drug management:  I ordered medication including ofloxacin and amoxicillin   for otitis media, otitis externa  I have reviewed the patients home medicines and have made adjustments as needed    Problem List / ED Course:  Right ear fullness Will cover for otitis media and externa as she has debris in outer ear as well as erythematous TM in right ear with amox and ofloxacin. Left ear appears normal No decreased hearing. TM does not appear perforated No tragal nor mastoid tenderness. No meningismus  Also provided ENT recommendation to f/u in 5-7 days following treatment to see if improved   Reevaluation:  After the interventions noted above, I reevaluated the patient and found that they have :stayed the same   Social Determinants of Health:  No PCP - provided recommendation   Dispostion:  After consideration of the diagnostic results and the patients response to treatment, I feel that the patent would benefit from outpatient management with ENT f/u.  Discussed ED workup, disposition, return to  ED precautions with patient who expresses understanding agrees with plan.  All questions answered to their satisfaction.  They are agreeable to plan.  Discharge instructions provided on paperwork  Final diagnoses:  Acute otitis media, unspecified otitis media type  Acute otitis externa of right ear, unspecified type    ED Discharge Orders          Ordered    amoxicillin  (AMOXIL ) 875 MG tablet  2 times daily        07/16/23 1706               Royann Cords, PA 07/16/23 2001  Roberts Ching, MD 07/17/23 [phone]
